# Patient Record
Sex: Female | Born: 1935 | Race: White | Hispanic: No | Marital: Single | Smoking: Never smoker
Health system: Southern US, Community
[De-identification: ages and names within clinical notes are randomized; demographics above are authoritative.]

## PROBLEM LIST (undated history)

## (undated) DIAGNOSIS — Z789 Other specified health status: Secondary | ICD-10-CM

## (undated) HISTORY — PX: OTHER SURGICAL HISTORY: SHX169

---

## 2013-09-24 ENCOUNTER — Inpatient Hospital Stay: Payer: Self-pay | Admitting: Internal Medicine

## 2013-09-24 DIAGNOSIS — I059 Rheumatic mitral valve disease, unspecified: Secondary | ICD-10-CM

## 2013-09-24 LAB — APTT: Activated PTT: <23 secs — ABNORMAL LOW (ref 23.6–35.9)

## 2013-09-24 LAB — CBC WITH DIFFERENTIAL/PLATELET
Basophil #: 0 10*3/uL (ref 0.0–0.1)
Basophil %: 0.1 %
Eosinophil #: 0 10*3/uL (ref 0.0–0.7)
Eosinophil %: 0 %
HCT: 40.1 % (ref 35.0–47.0)
Lymphocyte #: 0.8 10*3/uL — ABNORMAL LOW (ref 1.0–3.6)
Lymphocyte %: 6.3 %
MCHC: 33.6 g/dL (ref 32.0–36.0)
Monocyte #: 0.9 x10 3/mm (ref 0.2–0.9)
Monocyte %: 7.2 %
Neutrophil #: 11.1 10*3/uL — ABNORMAL HIGH (ref 1.4–6.5)
Neutrophil %: 86.4 %
Platelet: 153 10*3/uL (ref 150–440)
RBC: 4.02 10*6/uL (ref 3.80–5.20)
RDW: 13 % (ref 11.5–14.5)
WBC: 12.9 10*3/uL — ABNORMAL HIGH (ref 3.6–11.0)

## 2013-09-24 LAB — COMPREHENSIVE METABOLIC PANEL
Albumin: 3.8 g/dL (ref 3.4–5.0)
Alkaline Phosphatase: 79 U/L
Anion Gap: 7 (ref 7–16)
BUN: 17 mg/dL (ref 7–18)
Bilirubin,Total: 0.9 mg/dL (ref 0.2–1.0)
Chloride: 98 mmol/L (ref 98–107)
Co2: 30 mmol/L (ref 21–32)
Creatinine: 0.83 mg/dL (ref 0.60–1.30)
EGFR (African American): >60
EGFR (Non-African Amer.): >60
Glucose: 178 mg/dL — ABNORMAL HIGH (ref 65–99)
Potassium: 2.9 mmol/L — ABNORMAL LOW (ref 3.5–5.1)
Total Protein: 7.4 g/dL (ref 6.4–8.2)

## 2013-09-24 LAB — URINALYSIS, COMPLETE
Bacteria: NONE SEEN
Bilirubin,UR: NEGATIVE
Blood: NEGATIVE
Glucose,UR: >=500 mg/dL (ref 0–75)
Hyaline Cast: 1
Nitrite: NEGATIVE
Ph: 9 (ref 4.5–8.0)
Protein: >=500
RBC,UR: 12 /HPF (ref 0–5)
Specific Gravity: 1.011 (ref 1.003–1.030)
WBC UR: 2 /HPF (ref 0–5)

## 2013-09-24 LAB — CK-MB
CK-MB: 7.2 ng/mL — ABNORMAL HIGH (ref 0.5–3.6)
CK-MB: 6 ng/mL — ABNORMAL HIGH (ref 0.5–3.6)

## 2013-09-24 LAB — TROPONIN I
Troponin-I: 0.32 ng/mL — ABNORMAL HIGH
Troponin-I: 0.29 ng/mL — ABNORMAL HIGH

## 2013-09-24 LAB — CK TOTAL AND CKMB (NOT AT ARMC)
CK, Total: 531 U/L — ABNORMAL HIGH (ref 21–215)
CK-MB: 7 ng/mL — ABNORMAL HIGH (ref 0.5–3.6)

## 2013-09-24 LAB — PROTIME-INR
INR: 1
Prothrombin Time: 12.9 secs (ref 11.5–14.7)

## 2013-09-25 LAB — LIPID PANEL
Cholesterol: 195 mg/dL (ref 0–200)
Ldl Cholesterol, Calc: 97 mg/dL (ref 0–100)

## 2013-09-25 LAB — MAGNESIUM: Magnesium: 1.7 mg/dL — ABNORMAL LOW

## 2013-09-27 LAB — CBC WITH DIFFERENTIAL/PLATELET
Basophil #: 0.1 10*3/uL (ref 0.0–0.1)
Basophil %: 1 %
Eosinophil #: 0.3 10*3/uL (ref 0.0–0.7)
Eosinophil %: 4.3 %
Lymphocyte #: 2 10*3/uL (ref 1.0–3.6)
Lymphocyte %: 27 %
Neutrophil %: 58 %

## 2013-09-27 LAB — BASIC METABOLIC PANEL
Anion Gap: 7 (ref 7–16)
Calcium, Total: 8.9 mg/dL (ref 8.5–10.1)
Chloride: 110 mmol/L — ABNORMAL HIGH (ref 98–107)
EGFR (African American): >60
Glucose: 110 mg/dL — ABNORMAL HIGH (ref 65–99)
Osmolality: 280 (ref 275–301)
Potassium: 4.2 mmol/L (ref 3.5–5.1)
Sodium: 140 mmol/L (ref 136–145)

## 2013-09-27 LAB — MAGNESIUM: Magnesium: 2.1 mg/dL

## 2013-10-05 ENCOUNTER — Other Ambulatory Visit: Payer: Self-pay | Admitting: Family Medicine

## 2013-10-05 LAB — URINALYSIS, COMPLETE
Bilirubin,UR: NEGATIVE
Glucose,UR: NEGATIVE mg/dL (ref 0–75)
Leukocyte Esterase: NEGATIVE
Nitrite: POSITIVE
Ph: 5 (ref 4.5–8.0)
RBC,UR: 1 /HPF (ref 0–5)
Squamous Epithelial: NONE SEEN
WBC UR: 6 /HPF (ref 0–5)

## 2015-01-31 NOTE — Discharge Summary (Signed)
PATIENT NAME:  Kim [name], Kim [name] MR#:  [id number] DATE OF BIRTH:  [birth year]  DATE OF ADMISSION:  09/24/2013 DATE OF DISCHARGE:  09/27/2013  DISCHARGE DIAGNOSES: 1.  Syncopal episode due to dehydration and gastroenteritis, severe, ruled out by MRI.  2.  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 Needs outpatient followup.  3.  Hypokalemia.  4.  Hypertension.   CODE STATUS ON DISCHARGE: FULL CODE.   DISCHARGE MEDICATIONS: 1.  Loperamide 2 mg oral capsule 4 times a day for 5 days as needed for diarrhea.  2.  Atorvastatin 20 mg once a day.  3.  Amlodipine 1 tablet once a day.  4.  Aspirin 81 mg once a day.   DIET ON DISCHARGE: Low sodium.   Advised to follow up within 2 to 4 weeks with primary care physician and within 1 to 2 months with a vascular surgeon for carotid artery blockages.   HISTORY OF PRESENT ILLNESS: On 09/24/2014 by me, a 80 year old female with unknown past medical history and not seeing a doctor, last she saw [facility], possibly in 2008, found having confused, not able to give any more history, obtained from the patient's neighbor and good friend, Mr. Lorne Skeens, who was present in the room. As per him, the patient lives alone, not taking good care of herself,  old fashioned, not having even a stove for cooking. She used old fashioned wood stove, and also uses that one for heating in the home. As per him, she was not nicely cooking or eating every day for the last few days and was getting weaker.  Her nephew, who lives in a nearby town, called her, and she did not respond, so he called the neighbor, Mr. Lorne Skeens, to go and check, and they found her not opening the door, so EMS arrived, and they opened the house and found her in the kitchen lying nearby the fridge, was confused and lethargic. Her house was very cold, no heating in the house, and she was covered in vomitus and urine on presentation.   HOSPITAL COURSE AND STAY:  1.  With the syncopal episode and altered mental  status on presentation, she was admitted with possibility of rule out CVA, as on CT of the brain there was some suspect of right-sided lacunar infarct, admitted to telemetry floor and IV fluids were given. The patient had significant improvement in her mental status. After she got treatment of her gastroenteritis, her diarrhea stopped with symptomatic treatment. MRI of the brain does not show any acute infarct. Also we did workup with echocardiogram, showed ejection fraction 50% to 55%, and carotid Doppler study shows internal carotid artery less than 50% on the right side, and left side 50% to 69% stenosis, so we advised her to follow with a vascular doctor in the clinic.  For her diarrhea and vomiting, we gave symptomatic treatment of Zofran and loperamide and IV fluids, and she responded nicely to that. She was also found having elevated troponin up to the range of 0.3, but on further followup it remained stable at that level, and we attributed it to gastroenteritis and episode of falling down and dehydration.  2.  Hypokalemia and hypomagnesemia. She was not eating well and with this diarrhea, she found having hypokalemia. We replaced it orally, and she had good response to that.  3. Overall poor living conditions. Social worker stepped into this issue and arranged for her to go to rehab as was advised by physical therapy.   IMPORTANT  LABORATORY RESULTS IN THE HOSPITAL: CK level was 531 on admission. WBC was 12.9. Hemoglobin 13.5, platelet count 153 and MCV was 100. Creatinine was 0.83, sodium 135 and potassium was 2.9 on admission. Troponin was 0.17. Urinalysis was grossly negative with 5 WBCs, leukocyte esterase negative, nitrite negative. Urine culture remained negative. CT cervical spine without contrast was done on admission with CT head, and it showed mild age-related atrophy, ventriculomegaly, probable remote lacunar infarct in basal ganglia, suspect right cerebellar infarct .  US carotid Doppler, as  mentioned above, estimated left internal carotid artery stenosis of 50% to 69% and right internal carotid artery stenosis less than 50%. Troponin went up to 0.29 on further followup and remained stable at that level. MRA of the brain without contrast did not show any acute finding, and her potassium level improved to 4.2 on further followup after 2 days of treatment and replacements. Hemoglobin remained stable at 12.0.   TOTAL TIME SPENT ON THIS DISCHARGE: 40 minutes   ____________________________ Hope PigeonVaibhavkumar G. Elisabeth PigeonVachhani, MD vgv:dmm D: 09/27/2013 10:53:00 ET T: 09/27/2013 11:19:34 ET JOB#: [id number]  cc: Hope PigeonVaibhavkumar G. Elisabeth PigeonVachhani, MD, <Dictator> Sydnee Cabalhomas H. Butler DenmarkWroth, MD Altamese DillingVAIBHAVKUMAR Reedy Biernat MD ELECTRONICALLY SIGNED 10/01/2013 14:38

## 2015-01-31 NOTE — H&P (Signed)
PATIENT NAME:  Kim Kim [name], Kim Kim [name] MR#:  [id number] DATE OF BIRTH:  [birth year]  DATE OF ADMISSION:  09/24/2013  PRIMARY CARE PHYSICIAN: None.   REFERRING EMERGENCY ROOM PHYSICIAN: Dr. Rockne MenghiniAnne-Caroline Norman.  CHIEF COMPLAINT: Syncopal episode, fall.   HISTORY OF PRESENT ILLNESS: The patient is Kim [name] with unknown past medical history, not seeing Kim [name] doctor. Last time she saw [facility], possibly in 2008, but she is confused so not able to give more history. History obtained from patient's neighbor and good friend who is present in the room, Mr. Royal Piedraimmy Vann. As per him, the patient lives alone and not taking good care of herself. She is old-fashioned and she does not have even Kim [name] stove for cooking at home. She uses old-fashioned wood stove for cooking and does not have heating at home, and he suspects that she has not been cooking even every day and has not been eating very well. Today morning, the patient's nephew who lives in nearby town called her. She was not responding, so he finally called this neighbor, Mr. Lorne Skeensimmy, to go and check on her and he found that she is not opening the door. There is no sounds or no noise in the house. They finally called sheriff and EMS, and they opened the door and went inside from window, found her on the floor in kitchen between fridge and freezer and very confused and lethargic so brought to the Emergency Room. Her house was very cold, no heat in the house, and she was covered with her vomitus and urine. On further questioning, he agrees that after coming to ER and receiving some IV fluid, the patient perked up Kim [name] little bit, and she is more responsive now. The patient is talking to me, but she is confused about which month it is. She is able to tell me about 2014, and she identifies the persons around her, her neighbor and nephew, but she does not know exactly what happened and how she fell down and when she fell down. She claims that she woke up and did her routine  morning stuff, and after that she does not know what happened, but neighbor is doubtful about that, that she is very sure about this event. The patient complains that she has been vomiting and some diarrhea for the last few days, but she claims that she was still earing and was able to keep it down until yesterday, which is again not very reliable as per the neighbor.   REVIEW OF SYSTEMS:  CONSTITUTIONAL: Negative for fever. Positive for generalized weakness. No pain or weight loss.  EYES: No blurring, double vision, discharge, or redness.  EARS, NOSE, THROAT: No tinnitus, ear pain, or hearing loss.  RESPIRATORY: No cough, wheezing, hemoptysis, shortness of breath.  CARDIOVASCULAR: No chest pain, orthopnea, edema, or palpitations but had syncopal episode. The patient does not know much details.  GASTROINTESTINAL: Has nausea, vomiting, and some diarrhea but no abdominal pain.  GENITOURINARY: No dysuria, hematuria, or increased frequency.  ENDOCRINE: No increased sweating. No heat or cold intolerance.  SKIN: No acne, rashes, or lesions in the skin.  MUSCULOSKELETAL: No pain or swelling in the joints.  NEUROLOGIC: No numbness or focal weakness. Has generalized weakness.   PAST MEDICAL HISTORY: Not known.  PAST SURGICAL HISTORY: Gallbladder surgery years ago.   SOCIAL HISTORY: She is not married, has no kids, lives alone on her own, and neighbors or good friends taking care of her day-to-day requirements. Not Kim [name] smoker, no drinking  alcohol, and not any illegal drug use.   FAMILY HISTORY: As per her nephew who is present in the room, one of her sisters had Kim [name] heart problem, and her brother also had heart issues. Both of them had it at reasonably younger age of 43s.   HOME MEDICATIONS: None.   PHYSICAL EXAMINATION: VITAL SIGNS: In ER, temperature 98.3, pulse 92, respirations 20, blood pressure 195/147 on presentation, which is 157/61 now, oxygen saturation 96 on room air.  GENERAL: The patient is  alert but oriented x 2, not very little to the events that happened and time,  but cooperative with history taking and physical examination. Appears overall very disheveled and not overall taking care of herself nicely.  HEENT: Head and neck atraumatic. Conjunctivae pink. Oral mucosa dry.  NECK: Supple. No JVD.  RESPIRATORY: Bilateral clear and equal air entry.  CARDIOVASCULAR: S1, S2 present. Systolic murmur present.  ABDOMEN: Soft, nontender. Bowel sounds present. No organomegaly.  SKIN: No rashes, but there is Kim [name] small abrasion mark on the left lower limb.  MUSCULOSKELETAL: Joints: No swelling or tenderness.  NEUROLOGICAL: Able to move all 4 limbs and follows commands, but left lower limb appears slightly weaker than right side. Power 4 out of 5 all 4 limbs. Coordination: Unable to check, as the patient appears slightly weak and I do not want her to stand up and check the balance because of fear of falling down.   IMPORTANT LABORATORY AND RADIOLOGICAL RESULTS: Glucose 178, BUN 17, creatinine 0.83, sodium 135, potassium 2.9, chloride 98, CO2 of 30, calcium 8.7. Troponin 0.17, CK total 531. WBC 12.9, hemoglobin 13.5, platelet count 153. Urinalysis is grossly negative. CT scan of the cervical spine and head done, which showed mild age-related cerebral atrophy, ventriculomegaly, and periventricular white matter disease. Probable remote lacunar-type basal ganglia infarct. Suspect right cerebellar infarct of uncertain age. MRI of brain recommendation for further evaluation. Severe degenerative cervical spondylosis with cervical multiple disk disease and says that this is no acute cervical spine fracture. Chest x-ray without any significant finding.   ASSESSMENT AND PLAN: Kim [name] with unknown past medical history and not following very well with any doctor, not taking medication, and living alone, not eating very well, and no heating in the house. Found on the floor kind of confused today  morning. Had diarrhea and vomiting for the last 2 days.  1.  Possible acute infarct: As CT scan of the brain suspects right cerebellar infarct, that might be the cause of her syncopal episode and confusion but will have to confirm with MRI. Will get physical therapy evaluation, echocardiogram, and carotid Doppler study. Will check lipid panel, give her aspirin and statin for now, and continue neuro checks.  2.  Diarrhea and vomiting: Most likely this is viral gastroenteritis, and it caused dehydration and her to fall down. Will give symptomatic management with loperamide and Zofran.  3.  Elevated troponin: Most likely it is due to stress of fall and gastroenteritis. Will still follow serial troponins every 4 hours for 3 times to make sure it is not rising and not an acute coronary event.  4.  Hypokalemia: Likely it is due to diarrhea. We will replace it and check magnesium level. 5.  Overall poor living conditions. I will call case manager services to look into this issue. Maybe she needs Adult Protective Services or some kind of social support and help to establish heating in her house and to help her with these issues.   TOTAL  TIME SPENT ON THIS ADMISSION: 50 minutes.   ____________________________ Hope Pigeon Elisabeth Pigeon, MD vgv:jcm D: 09/24/2013 16:21:06 ET T: 09/24/2013 18:19:54 ET JOB#: [id number]  cc: Hope Pigeon. Elisabeth Pigeon, MD, <Dictator> Altamese Dilling MD ELECTRONICALLY SIGNED 10/01/2013 14:36

## 2018-10-08 ENCOUNTER — Emergency Department: Payer: Medicare Other

## 2018-10-08 ENCOUNTER — Observation Stay: Payer: Medicare Other

## 2018-10-08 ENCOUNTER — Other Ambulatory Visit: Payer: Self-pay

## 2018-10-08 ENCOUNTER — Encounter: Payer: Self-pay | Admitting: Emergency Medicine

## 2018-10-08 ENCOUNTER — Inpatient Hospital Stay
Admission: EM | Admit: 2018-10-08 | Discharge: 2018-10-11 | DRG: 558 | Disposition: A | Discharge status: Skilled Nursing Facility | Payer: Medicare Other | Attending: Internal Medicine | Admitting: Internal Medicine

## 2018-10-08 DIAGNOSIS — M25512 Pain in left shoulder: Secondary | ICD-10-CM | POA: Diagnosis present

## 2018-10-08 DIAGNOSIS — M6282 Rhabdomyolysis: Principal | ICD-10-CM | POA: Diagnosis present

## 2018-10-08 DIAGNOSIS — Z7982 Long term (current) use of aspirin: Secondary | ICD-10-CM

## 2018-10-08 DIAGNOSIS — L89152 Pressure ulcer of sacral region, stage 2: Secondary | ICD-10-CM | POA: Diagnosis present

## 2018-10-08 DIAGNOSIS — L8990 Pressure ulcer of unspecified site, unspecified stage: Secondary | ICD-10-CM

## 2018-10-08 DIAGNOSIS — Z66 Do not resuscitate: Secondary | ICD-10-CM | POA: Diagnosis present

## 2018-10-08 DIAGNOSIS — E870 Hyperosmolality and hypernatremia: Secondary | ICD-10-CM | POA: Diagnosis present

## 2018-10-08 DIAGNOSIS — N3000 Acute cystitis without hematuria: Secondary | ICD-10-CM | POA: Diagnosis present

## 2018-10-08 DIAGNOSIS — E871 Hypo-osmolality and hyponatremia: Secondary | ICD-10-CM | POA: Diagnosis present

## 2018-10-08 DIAGNOSIS — Z8249 Family history of ischemic heart disease and other diseases of the circulatory system: Secondary | ICD-10-CM | POA: Diagnosis not present

## 2018-10-08 DIAGNOSIS — E860 Dehydration: Secondary | ICD-10-CM | POA: Diagnosis present

## 2018-10-08 DIAGNOSIS — R778 Other specified abnormalities of plasma proteins: Secondary | ICD-10-CM

## 2018-10-08 DIAGNOSIS — R9431 Abnormal electrocardiogram [ECG] [EKG]: Secondary | ICD-10-CM | POA: Diagnosis present

## 2018-10-08 DIAGNOSIS — R7989 Other specified abnormal findings of blood chemistry: Secondary | ICD-10-CM | POA: Diagnosis present

## 2018-10-08 DIAGNOSIS — R531 Weakness: Secondary | ICD-10-CM

## 2018-10-08 DIAGNOSIS — D638 Anemia in other chronic diseases classified elsewhere: Secondary | ICD-10-CM | POA: Diagnosis present

## 2018-10-08 DIAGNOSIS — W19XXXA Unspecified fall, initial encounter: Secondary | ICD-10-CM | POA: Diagnosis present

## 2018-10-08 DIAGNOSIS — Z79899 Other long term (current) drug therapy: Secondary | ICD-10-CM | POA: Diagnosis not present

## 2018-10-08 DIAGNOSIS — B9620 Unspecified Escherichia coli [E. coli] as the cause of diseases classified elsewhere: Secondary | ICD-10-CM | POA: Diagnosis present

## 2018-10-08 DIAGNOSIS — R52 Pain, unspecified: Secondary | ICD-10-CM

## 2018-10-08 HISTORY — DX: Other specified health status: Z78.9

## 2018-10-08 LAB — COMPREHENSIVE METABOLIC PANEL
ALT: 58 U/L — ABNORMAL HIGH (ref 0–44)
ANION GAP: 8 (ref 5–15)
AST: 81 U/L — ABNORMAL HIGH (ref 15–41)
Albumin: 3.8 g/dL (ref 3.5–5.0)
Alkaline Phosphatase: 60 U/L (ref 38–126)
BUN: 59 mg/dL — ABNORMAL HIGH (ref 8–23)
CALCIUM: 9.2 mg/dL (ref 8.9–10.3)
CO2: 25 mmol/L (ref 22–32)
Chloride: 113 mmol/L — ABNORMAL HIGH (ref 98–111)
Creatinine, Ser: 1.39 mg/dL — ABNORMAL HIGH (ref 0.44–1.00)
GFR calc Af Amer: 41 mL/min — ABNORMAL LOW (ref >60)
GFR calc non Af Amer: 35 mL/min — ABNORMAL LOW (ref >60)
Glucose, Bld: 120 mg/dL — ABNORMAL HIGH (ref 70–99)
Potassium: 4 mmol/L (ref 3.5–5.1)
Sodium: 146 mmol/L — ABNORMAL HIGH (ref 135–145)
Total Bilirubin: 0.8 mg/dL (ref 0.3–1.2)
Total Protein: 6.5 g/dL (ref 6.5–8.1)

## 2018-10-08 LAB — CBC WITH DIFFERENTIAL/PLATELET
Abs Immature Granulocytes: 0.04 10*3/uL (ref 0.00–0.07)
Basophils Absolute: 0 10*3/uL (ref 0.0–0.1)
Basophils Relative: 0 %
EOS PCT: 0 %
Eosinophils Absolute: 0 10*3/uL (ref 0.0–0.5)
HCT: 37.6 % (ref 36.0–46.0)
Hemoglobin: 12.4 g/dL (ref 12.0–15.0)
Immature Granulocytes: 0 %
Lymphocytes Relative: 10 %
Lymphs Abs: 1.1 10*3/uL (ref 0.7–4.0)
MCH: 34.4 pg — ABNORMAL HIGH (ref 26.0–34.0)
MCHC: 33 g/dL (ref 30.0–36.0)
MCV: 104.4 fL — ABNORMAL HIGH (ref 80.0–100.0)
Monocytes Absolute: 0.8 10*3/uL (ref 0.1–1.0)
Monocytes Relative: 7 %
Neutro Abs: 8.9 10*3/uL — ABNORMAL HIGH (ref 1.7–7.7)
Neutrophils Relative %: 83 %
Platelets: 148 10*3/uL — ABNORMAL LOW (ref 150–400)
RBC: 3.6 MIL/uL — AB (ref 3.87–5.11)
RDW: 12.9 % (ref 11.5–15.5)
WBC: 10.9 10*3/uL — AB (ref 4.0–10.5)
nRBC: 0 % (ref 0.0–0.2)

## 2018-10-08 LAB — URINALYSIS, COMPLETE (UACMP) WITH MICROSCOPIC
Bilirubin Urine: NEGATIVE
Glucose, UA: NEGATIVE mg/dL
Ketones, ur: NEGATIVE mg/dL
NITRITE: NEGATIVE
Protein, ur: NEGATIVE mg/dL
Specific Gravity, Urine: 1.021 (ref 1.005–1.030)
pH: 5 (ref 5.0–8.0)

## 2018-10-08 LAB — TROPONIN I
Troponin I: 0.53 ng/mL (ref <0.03)
Troponin I: 0.34 ng/mL (ref <0.03)
Troponin I: 0.46 ng/mL (ref <0.03)

## 2018-10-08 LAB — CK: Total CK: 1025 U/L — ABNORMAL HIGH (ref 38–234)

## 2018-10-08 MED ORDER — ASPIRIN 81 MG PO CHEW
81.0000 mg | CHEWABLE_TABLET | Freq: Every day | ORAL | Status: DC
  Administered 2018-10-09 – 2018-10-11 (×3): 81 mg via ORAL
  Filled 2018-10-08 (×3): qty 1

## 2018-10-08 MED ORDER — ONDANSETRON HCL 4 MG/2ML IJ SOLN: 4.0000 mg | Freq: Four times a day (QID) | INTRAMUSCULAR | Status: DC | PRN

## 2018-10-08 MED ORDER — ACETAMINOPHEN 650 MG RE SUPP: 650.0000 mg | Freq: Four times a day (QID) | RECTAL | Status: DC | PRN

## 2018-10-08 MED ORDER — ONDANSETRON HCL 4 MG PO TABS: 4.0000 mg | ORAL_TABLET | Freq: Four times a day (QID) | ORAL | Status: DC | PRN

## 2018-10-08 MED ORDER — ADULT MULTIVITAMIN W/MINERALS CH
1.0000 | ORAL_TABLET | Freq: Every day | ORAL | Status: DC
  Administered 2018-10-09 – 2018-10-11 (×2): 1 via ORAL
  Filled 2018-10-08 (×2): qty 1

## 2018-10-08 MED ORDER — ACETAMINOPHEN 325 MG PO TABS
650.0000 mg | ORAL_TABLET | Freq: Four times a day (QID) | ORAL | Status: DC | PRN
  Administered 2018-10-08: 650 mg via ORAL
  Filled 2018-10-08 (×2): qty 2

## 2018-10-08 MED ORDER — SODIUM CHLORIDE 0.9 % IV SOLN
1.0000 g | INTRAVENOUS | Status: DC
  Administered 2018-10-08 – 2018-10-09 (×2): 1 g via INTRAVENOUS
  Filled 2018-10-08 (×3): qty 10

## 2018-10-08 MED ORDER — ACETAMINOPHEN 500 MG PO TABS: 500.0000 mg | ORAL_TABLET | Freq: Four times a day (QID) | ORAL | Status: DC | PRN

## 2018-10-08 MED ORDER — SODIUM CHLORIDE 0.9 % IV SOLN
INTRAVENOUS | Status: DC
  Administered 2018-10-08: 18:00:00 via INTRAVENOUS

## 2018-10-08 NOTE — Plan of Care (Signed)
  Problem: Education: Goal: Knowledge of General Education information will improve Description Including pain rating scale, medication(s)/side effects and non-pharmacologic comfort measures Outcome: Progressing   Problem: Clinical Measurements: Goal: Ability to maintain clinical measurements within normal limits will improve Outcome: Progressing Goal: Diagnostic test results will improve Outcome: Progressing   Problem: Pain Managment: Goal: General experience of comfort will improve Outcome: Progressing   

## 2018-10-08 NOTE — Clinical Social Work Note (Signed)
CSW received consult for possible SNF placement. PT is pending. CSW will assess once PT has evaluated.  Argentina PonderKaren Martha Desarai Barrack, MSW, Theresia MajorsLCSWA [phone]

## 2018-10-08 NOTE — ED Triage Notes (Signed)
Pt from home via ems- lives alone states that she fell once on 12/24 and again on the 27th. Pt c/o left shoulder and hip pain. Multiple skin tears. Pt A/O. Denies LOC, no use of blood thinners. Per EMS pt ambulatory on scene.

## 2018-10-08 NOTE — ED Provider Notes (Signed)
[facility] Emergency Department Provider Note  ____________________________________________   I have reviewed the triage vital signs and the nursing notes.   HISTORY  Chief Complaint Fall   History limited by: Not Limited   HPI Kim [name] is a 83 y.o. female who presents to the emergency department today because of concern for two falls over the past week. The patient states that she does not have a history of frequent falls. She has felt weak over the past week. Has not been able to get herself up after her falls. She is complaining of some pain in her left shoulder and left buttock. She is also complaining of a headache and states she did hit her head. She denies any fevers. No chest pain, shortness of breath. She denies any bloody stool or vomiting.    History reviewed. No pertinent past medical history.  There are no active problems to display for this patient.   Prior to Admission medications   Medication Sig Start Date End Date Taking? Authorizing Provider  acetaminophen (TYLENOL) 500 MG tablet Take 500 mg by mouth every 6 (six) hours as needed.   Yes [provider]  aspirin 81 MG chewable tablet Chew 81 mg by mouth daily.   Yes [provider]  Multiple Vitamins-Minerals (MULTIVITAMIN ADULT) TABS Take 1 tablet by mouth daily.   Yes [provider]    Allergies Patient has no known allergies.  No family history on file.  Social History Social History   Tobacco Use  . Smoking status: Not on file  Substance Use Topics  . Alcohol use: Not on file  . Drug use: Not on file    Review of Systems Constitutional: No fever/chills Eyes: No visual changes. ENT: No sore throat. Cardiovascular: Denies chest pain. Respiratory: Denies shortness of breath. Gastrointestinal: No abdominal pain.  No nausea, no vomiting.  No diarrhea.   Genitourinary: Negative for dysuria. Musculoskeletal: Positive for left buttock and  left shoulder pain.  Skin: Positive for redness to her left buttock. Neurological: Positive for headache. ____________________________________________   PHYSICAL EXAM:  VITAL SIGNS: ED Triage Vitals  Enc Vitals Group     BP 10/08/18 1420 138/76     Pulse Rate 10/08/18 1420 79     Resp 10/08/18 1420 20     Temp 10/08/18 1420 97.6 F (36.4 C)     Temp Source 10/08/18 1420 Oral     SpO2 10/08/18 1420 100 %     Weight 10/08/18 1423 148 lb (67.1 kg)     Height 10/08/18 1423 5\' 8"  (1.727 m)     Head Circumference --      Peak Flow --      Pain Score 10/08/18 1421 10   Constitutional: Alert and oriented.  Eyes: Conjunctivae are normal.  ENT      Head: Normocephalic and atraumatic.      Nose: No congestion/rhinnorhea.      Mouth/Throat: Mucous membranes are moist.      Neck: No stridor. Hematological/Lymphatic/Immunilogical: No cervical lymphadenopathy. Cardiovascular: Normal rate, regular rhythm.  No murmurs, rubs, or gallops.  Respiratory: Normal respiratory effort without tachypnea nor retractions. Breath sounds are clear and equal bilaterally. No wheezes/rales/rhonchi. Gastrointestinal: Soft and non tender. No rebound. No guarding.  Genitourinary: Deferred Musculoskeletal:  No tenderness to palpation over bilateral shoulders, elbows, hips or knees.  Patient with some limited range of motion of the left elbow Neurologic:  Normal speech and language. No gross focal neurologic deficits are  appreciated.  Skin: Redness a breakdown noted to the left buttock Psychiatric: Mood and affect are normal. Speech and behavior are normal. Patient exhibits appropriate insight and judgment.  ____________________________________________    LABS (pertinent positives/negatives)  CBC wbc 10.9, hgb 12.4, plt 148 CMP na 146, glu 120, cr 1.39 Trop 0.53  ____________________________________________   EKG  I, Phineas SemenGraydon Saahas Hidrogo, attending physician, personally viewed and interpreted this  EKG  EKG Time: 1604 Rate: 82 Rhythm: sinus rhythm Axis: normal Intervals: qtc 448 QRS: narrow, q waves V1, V2 ST changes: no st elevation Impression: abnormal ekg   ____________________________________________    RADIOLOGY  CT head No acute findings  ____________________________________________   PROCEDURES  Procedures  ____________________________________________   INITIAL IMPRESSION / ASSESSMENT AND PLAN / ED COURSE  Pertinent labs & imaging results that were available during my care of the patient were reviewed by me and considered in my medical decision making (see chart for details).   Patient presented to the emergency department today because of concerns for falls and roughly weeklong history of increased weakness.  Patient without any significant acute traumatic findings however his head CT was performed given that she stated she hit her head.  This was negative.  Patient's blood work was concerning for an elevated troponin.  Will plan on admission to the hospital service.  Discussed findings and plan with patient.   ____________________________________________   FINAL CLINICAL IMPRESSION(S) / ED DIAGNOSES  Final diagnoses:  Weakness  Fall, initial encounter  Elevated troponin     Note: This dictation was prepared with Dragon dictation. Any transcriptional errors that result from this process are unintentional     Phineas SemenGoodman, Manav Pierotti, MD 10/08/18 [phone]

## 2018-10-08 NOTE — H&P (Signed)
[facility] [place] at [facility]   PATIENT NAME: Kim [name]    MR#:  [id number]  DATE OF BIRTH:  1935-07-13  DATE OF ADMISSION:  10/08/2018  PRIMARY CARE PHYSICIAN: Center, [facility]   REQUESTING/REFERRING PHYSICIAN: Dr Tery Sanfilippo  CHIEF COMPLAINT:   Chief Complaint  Patient presents with  . Fall    HISTORY OF PRESENT ILLNESS:  Kim [name]  is a 83 y.o. female comes in after having a few falls at home.  She fell on Christmas Eve and was on the floor at least overnight.  She recently had a fall with carrying wood into the house.  She normally walks with a cane but when she is carrying wood she does not use a cane.  She has been very weak.  Her urine has been dark.  She was noted to have a sore on her backside.  She lives home alone.  Her son is able to check on her numerous times a day.  Patient states that she does have some chills and sweats.  Slight cough but nonproductive.  In the ER she was found to have a borderline troponin and hospitalist services were contacted for further evaluation.  The patient denies chest pain or shortness of breath.  PAST MEDICAL HISTORY:   Past Medical History:  Diagnosis Date  . Medical history non-contributory     PAST SURGICAL HISTORY:   Past Surgical History:  Procedure Laterality Date  . intestinal blockage      SOCIAL HISTORY:   Social History   Tobacco Use  . Smoking status: Never Smoker  . Smokeless tobacco: Never Used  Substance Use Topics  . Alcohol use: Never    Frequency: Never    FAMILY HISTORY:   Family History  Problem Relation Age of Onset  . CAD Father     DRUG ALLERGIES:  No Known Allergies  REVIEW OF SYSTEMS:  CONSTITUTIONAL: No fever, some chills and sweats.  Positive for weight loss.  Positive for weakness.  EYES: No blurred or double vision.  EARS, NOSE, AND THROAT: No tinnitus or ear pain.  Some sore throat RESPIRATORY: Some cough.no shortness of  breath, wheezing or hemoptysis.  CARDIOVASCULAR: No chest pain, orthopnea, edema.  GASTROINTESTINAL: No nausea, vomiting, diarrhea or abdominal pain. No blood in bowel movements GENITOURINARY: No dysuria, hematuria.  ENDOCRINE: No polyuria, nocturia,  HEMATOLOGY: No anemia, easy bruising or bleeding SKIN: No rash or lesion. MUSCULOSKELETAL: Positive for elbow pain and leg pain. NEUROLOGIC: No tingling, numbness, weakness.  PSYCHIATRY: No anxiety or depression.   MEDICATIONS AT HOME:   Prior to Admission medications   Medication Sig Start Date End Date Taking? Authorizing Provider  acetaminophen (TYLENOL) 500 MG tablet Take 500 mg by mouth every 6 (six) hours as needed.   Yes [provider]  aspirin 81 MG chewable tablet Chew 81 mg by mouth daily.   Yes [provider]  Multiple Vitamins-Minerals (MULTIVITAMIN ADULT) TABS Take 1 tablet by mouth daily.   Yes [provider]      VITAL SIGNS:  Blood pressure 138/76, pulse 79, temperature 97.6 F (36.4 C), temperature source Oral, resp. rate 20, height 5\' 8"  (1.727 m), weight 67.1 kg, SpO2 100 %.  PHYSICAL EXAMINATION:  GENERAL:  82 y.o.-year-old patient lying in the bed with no acute distress.  EYES: Pupils equal, round, reactive to light and accommodation. No scleral icterus. Extraocular muscles intact.  HEENT: Head atraumatic, normocephalic. Oropharynx and nasopharynx clear.  NECK:  Supple, no jugular venous distention. No thyroid enlargement, no tenderness.  LUNGS: Normal breath sounds bilaterally, no wheezing, rales,rhonchi or crepitation. No use of accessory muscles of respiration.  CARDIOVASCULAR: S1, S2 normal. No murmurs, rubs, or gallops.  ABDOMEN: Soft, nontender, nondistended. Bowel sounds present. No organomegaly or mass.  EXTREMITIES: No pedal edema, cyanosis, or clubbing.  Left elbow swelling and bruising NEUROLOGIC: Cranial nerves II through XII are intact. Muscle strength 4/5 in all  extremities. Sensation intact. Gait not checked.  PSYCHIATRIC: The patient is alert and answers questions appropriately.  SKIN: Her entire buttock and sacral area is erythematous, she does have a large blackish area there but the skin is intact.  LABORATORY PANEL:   CBC Recent Labs  Lab 10/08/18 1529  WBC 10.9*  HGB 12.4  HCT 37.6  PLT 148*   ------------------------------------------------------------------------------------------------------------------  Chemistries  Recent Labs  Lab 10/08/18 1529  NA 146*  K 4.0  CL 113*  CO2 25  GLUCOSE 120*  BUN 59*  CREATININE 1.39*  CALCIUM 9.2  AST 81*  ALT 58*  ALKPHOS 60  BILITOT 0.8   ------------------------------------------------------------------------------------------------------------------  Cardiac Enzymes Recent Labs  Lab 10/08/18 1529  TROPONINI 0.53*   ------------------------------------------------------------------------------------------------------------------  RADIOLOGY:  Dg Elbow Complete Left  Result Date: 10/08/2018 CLINICAL DATA:  Left elbow pain and swelling secondary to false on 10/03/2018 and 10/06/2018 EXAM: LEFT ELBOW - COMPLETE 3+ VIEW COMPARISON:  None. FINDINGS: There is no acute fracture. No dislocation or appreciable joint effusion. 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 There is soft tissue swelling posteriorly. IMPRESSION: No acute osseous abnormality. 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 Electronically Signed   By: Francene BoyersJames  Maxwell M.D.   On: 10/08/2018 16:38   Ct Head Wo Contrast  Result Date: 10/08/2018 CLINICAL DATA:  Multiple recent falls. EXAM: CT HEAD WITHOUT CONTRAST TECHNIQUE: Contiguous axial images were obtained from the base of the skull through the vertex without intravenous contrast. COMPARISON:  10/08/2013 FINDINGS: Brain: There is new extensive encephalomalacia involving the left temporal lobe, and there is  milder encephalomalacia involving the left frontal lobe. There is ex vacuo dilatation of the left lateral ventricle. No acute large territory infarct, intracranial hemorrhage, mass, midline shift, or extra-axial fluid collection is identified. There is a small chronic right cerebellar infarct. Patchy hypodensities in the cerebral white matter bilaterally are nonspecific but compatible with mild-to-moderate chronic small vessel ischemic disease, perhaps mildly progressed from the prior study. Chronic bilateral basal ganglia region lacunar infarcts are again noted. Vascular: Calcified atherosclerosis at the skull base. No hyperdense vessel. Skull: No fracture or focal osseous lesion. Sinuses/Orbits: Visualized paranasal sinuses and mastoid air cells are clear. Visualized orbits are unremarkable. Other: None. IMPRESSION: 1. No evidence of acute intracranial abnormality. 2. New encephalomalacia involving the left temporal and left frontal lobes which may reflect the sequelae of interval chronic infarcts or remote trauma. 3. Chronic small vessel ischemia with chronic lacunar infarcts as above. Electronically Signed   By: Sebastian AcheAllen  Grady M.D.   On: 10/08/2018 17:02    EKG:   Normal sinus rhythm 82 bpm ST depression inferiorly  IMPRESSION AND PLAN:   1.  Elevated troponin.  Not quite sure what to make of this with the patient denying chest pain or shortness of breath. Serial troponins.  Continue aspirin.  Monitor on telemetry and obtain an echocardiogram.  Check a CPK to see if this is rhabdomyolysis causing this. 2.  Acute cystitis.  Start  Rocephin and obtain a urine culture.  Gentle IV fluid hydration 3.  Stage II decubitus ulcer with an area of blackness.  Unsure if this is necrotic material there that needs debridement or if this is just bruising from the fall.  Surgical evaluation. 4. Weakness and falls.  Get pelvis x-ray make sure she has not broken a small bone marrow.  Physical therapy evaluation.  We will  get social worker involved.  Safety at home with all these falls is going to be an issue. 5.  Dehydration.  IV fluid hydration   All the records are reviewed and case discussed with ED provider. Management plans discussed with the patient, family and they are in agreement.  CODE STATUS: DNR  TOTAL TIME TAKING CARE OF THIS PATIENT: 50 minutes, including ACP time.    Alford Highlandichard Shenay Torti M.D on 10/08/2018 at 5:04 PM  Between 7am to 6pm - Pager - [phone]  After 6pm call admission pager [phone]  [facility] Office  [phone]  CC: Primary care physician; [facility]

## 2018-10-08 NOTE — ED Notes (Signed)
Patient undressed and placed in hospital gown. Patient with old bruising noted to left elbow and hip. Patient also had significant redness and abrasions noted to backside. Small abrasion noted to left scapula and left hip. Area cleaned and foam pad applied to sacrum and non-stick dressing applied to abrasions on hip.

## 2018-10-08 NOTE — Progress Notes (Addendum)
Patient ID: Kim [name], female   DOB: 01/18/1935, 83 y.o.   MRN: [id number]   ACP note  Patient and son at the bedside  Diagnosis: Elevated troponin, acute cystitis, stage II decubitus with area of black material, weakness and falls, dehydration  CODE STATUS discussed and patient wishes to be a DO NOT RESUSCITATE  Plan.  Gentle IV fluid hydration serial troponins echocardiogram monitor on telemetry.  Physical therapy evaluation.  Surgical evaluation on the decubitus whether this needs debridement or not.  Safety at home is a question.  Time spent on ACP discussion 17 minutes Dr. Alford Highlandichard Shawne Eskelson

## 2018-10-08 NOTE — Progress Notes (Signed)
Pt assisted x2 to stand, turn and sit on bed from stretcher. Pt placed on bed alarm, high fall risk due to frequent falls at home. Low bed to be ordered by Diplomatic Services operational officersecretary.

## 2018-10-08 NOTE — Progress Notes (Signed)
Patient ID: Kim [name], female   DOB: 07/30/1935, 83 y.o.   MRN: [id number]  CPK elevated  Change to inpatient status Dx rhabdomyolysis

## 2018-10-08 NOTE — ED Notes (Signed)
Patient assisted to use bedpan. Patient cleaned and repositioned in bed. Given sandwich tray and water to drink. Son at bedside.

## 2018-10-09 LAB — BASIC METABOLIC PANEL
Anion gap: 8 (ref 5–15)
BUN: 45 mg/dL — AB (ref 8–23)
CO2: 25 mmol/L (ref 22–32)
Calcium: 8.7 mg/dL — ABNORMAL LOW (ref 8.9–10.3)
Chloride: 114 mmol/L — ABNORMAL HIGH (ref 98–111)
Creatinine, Ser: 1.09 mg/dL — ABNORMAL HIGH (ref 0.44–1.00)
GFR calc Af Amer: 55 mL/min — ABNORMAL LOW (ref >60)
GFR calc non Af Amer: 48 mL/min — ABNORMAL LOW (ref >60)
GLUCOSE: 103 mg/dL — AB (ref 70–99)
Potassium: 4 mmol/L (ref 3.5–5.1)
SODIUM: 147 mmol/L — AB (ref 135–145)

## 2018-10-09 LAB — CBC
HCT: 32.8 % — ABNORMAL LOW (ref 36.0–46.0)
Hemoglobin: 10.7 g/dL — ABNORMAL LOW (ref 12.0–15.0)
MCH: 34.2 pg — ABNORMAL HIGH (ref 26.0–34.0)
MCHC: 32.6 g/dL (ref 30.0–36.0)
MCV: 104.8 fL — ABNORMAL HIGH (ref 80.0–100.0)
Platelets: 127 10*3/uL — ABNORMAL LOW (ref 150–400)
RBC: 3.13 MIL/uL — ABNORMAL LOW (ref 3.87–5.11)
RDW: 13 % (ref 11.5–15.5)
WBC: 7.5 10*3/uL (ref 4.0–10.5)
nRBC: 0 % (ref 0.0–0.2)

## 2018-10-09 LAB — CK: Total CK: 585 U/L — ABNORMAL HIGH (ref 38–234)

## 2018-10-09 MED ORDER — SODIUM CHLORIDE 0.45 % IV SOLN
INTRAVENOUS | Status: DC
  Administered 2018-10-09 (×2): via INTRAVENOUS

## 2018-10-09 MED ORDER — ENOXAPARIN SODIUM 40 MG/0.4ML ~~LOC~~ SOLN
40.0000 mg | SUBCUTANEOUS | Status: DC
  Administered 2018-10-09 – 2018-10-10 (×2): 40 mg via SUBCUTANEOUS
  Filled 2018-10-09 (×2): qty 0.4

## 2018-10-09 MED ORDER — TRAMADOL HCL 50 MG PO TABS
50.0000 mg | ORAL_TABLET | Freq: Two times a day (BID) | ORAL | Status: DC | PRN
  Administered 2018-10-09 – 2018-10-11 (×4): 50 mg via ORAL
  Filled 2018-10-09 (×5): qty 1

## 2018-10-09 NOTE — Consult Note (Signed)
WOC Nurse wound consult note Reason for Consult: 100% eschar to sacrum.  Unstageable pressure injury to sacrum  Surgery has evaluated and no intervention needed.  Will turn and reposition every two hours and offload. Wound type:unstageable pressure injury Pressure Injury POA: Yes Measurement 2 cmx 2 cm eschar Wound bed: dry stable eschar Drainage (amount, consistency, odor) none Periwound:intact Dressing procedure/placement/frequency:open to air.  Turn and reposition every two hours.  Will not follow at this time.  Please re-consult if needed.  Maple HudsonKaren Capri Veals MSN, RN, FNP-BC CWON Wound, Ostomy, Continence Nurse Pager [phone]

## 2018-10-09 NOTE — Progress Notes (Signed)
[facility] - [place] [facility]   PATIENT NAME: Kim [name]    MR#:  [id number]  DATE OF BIRTH:  04/24/1935  SUBJECTIVE:  CHIEF COMPLAINT:   Chief Complaint  Patient presents with  . Fall   Answers appropriately mostly. Poor historian.  REVIEW OF SYSTEMS:    Review of Systems  Unable to perform ROS: Mental status change    DRUG ALLERGIES:  No Known Allergies  VITALS:  Blood pressure 132/64, pulse (!) 59, temperature 98.4 F (36.9 C), temperature source Oral, resp. rate 19, height 5\' 2"  (1.575 m), weight 67.2 kg, SpO2 97 %.  PHYSICAL EXAMINATION:   Physical Exam  GENERAL:  83 y.o.-year-old patient lying in the bed with no acute distress.  EYES: Pupils equal, round, reactive to light and accommodation. No scleral icterus. Extraocular muscles intact.  HEENT: Head atraumatic, normocephalic. Oropharynx and nasopharynx clear.  NECK:  Supple, no jugular venous distention. No thyroid enlargement, no tenderness.  LUNGS: Normal breath sounds bilaterally, no wheezing, rales, rhonchi. No use of accessory muscles of respiration.  CARDIOVASCULAR: S1, S2 normal. No murmurs, rubs, or gallops.  ABDOMEN: Soft, nontender, nondistended. Bowel sounds present. No organomegaly or mass.  EXTREMITIES: No cyanosis, clubbing or edema b/l.    NEUROLOGIC: Cranial nerves II through XII are intact. Motor 5-/5 in upper and lower extremities PSYCHIATRIC: The patient is alert and awake SKIN: No obvious rash, lesion, or ulcer.   LABORATORY PANEL:   CBC Recent Labs  Lab 10/09/18 0343  WBC 7.5  HGB 10.7*  HCT 32.8*  PLT 127*   ------------------------------------------------------------------------------------------------------------------ Chemistries  Recent Labs  Lab 10/08/18 1529 10/09/18 0343  NA 146* 147*  K 4.0 4.0  CL 113* 114*  CO2 25 25  GLUCOSE 120* 103*  BUN 59* 45*  CREATININE 1.39* 1.09*  CALCIUM 9.2 8.7*  AST 81*  --   ALT 58*  --   ALKPHOS 60  --    BILITOT 0.8  --    ------------------------------------------------------------------------------------------------------------------  Cardiac Enzymes Recent Labs  Lab 10/08/18 2238  TROPONINI 0.46*   ------------------------------------------------------------------------------------------------------------------  RADIOLOGY:  Dg Pelvis 1-2 Views  Result Date: 10/08/2018 CLINICAL DATA:  Sacral pain.  No known injury. EXAM: PELVIS - 1-2 VIEW COMPARISON:  Radiograph of September 24, 2013. FINDINGS: There is no evidence of pelvic fracture or diastasis. No pelvic bone lesions are seen. IMPRESSION: Negative. Electronically Signed   By: Lupita RaiderJames  Green Jr, M.D.   On: 10/08/2018 17:39   Dg Elbow Complete Left  Result Date: 10/08/2018 CLINICAL DATA:  Left elbow pain and swelling secondary to false on 10/03/2018 and 10/06/2018 EXAM: LEFT ELBOW - COMPLETE 3+ VIEW COMPARISON:  None. FINDINGS: There is no acute fracture. No dislocation or appreciable joint effusion. 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 There is soft tissue swelling posteriorly. IMPRESSION: No acute osseous abnormality. 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 Electronically Signed   By: Francene BoyersJames  Maxwell M.D.   On: 10/08/2018 16:38   Ct Head Wo Contrast  Result Date: 10/08/2018 CLINICAL DATA:  Multiple recent falls. EXAM: CT HEAD WITHOUT CONTRAST TECHNIQUE: Contiguous axial images were obtained from the base of the skull through the vertex without intravenous contrast. COMPARISON:  10/08/2013 FINDINGS: Brain: There is new extensive encephalomalacia involving the left temporal lobe, and there is milder encephalomalacia involving the left frontal lobe. There is ex vacuo dilatation of the left lateral ventricle. No acute large territory infarct, intracranial hemorrhage, mass, midline  shift, or extra-axial fluid collection is identified. There is a small chronic right cerebellar  infarct. Patchy hypodensities in the cerebral white matter bilaterally are nonspecific but compatible with mild-to-moderate chronic small vessel ischemic disease, perhaps mildly progressed from the prior study. Chronic bilateral basal ganglia region lacunar infarcts are again noted. Vascular: Calcified atherosclerosis at the skull base. No hyperdense vessel. Skull: No fracture or focal osseous lesion. Sinuses/Orbits: Visualized paranasal sinuses and mastoid air cells are clear. Visualized orbits are unremarkable. Other: None. IMPRESSION: 1. No evidence of acute intracranial abnormality. 2. New encephalomalacia involving the left temporal and left frontal lobes which may reflect the sequelae of interval chronic infarcts or remote trauma. 3. Chronic small vessel ischemia with chronic lacunar infarcts as above. Electronically Signed   By: Sebastian AcheAllen  Grady M.D.   On: 10/08/2018 17:02   ASSESSMENT AND PLAN:   *Hyponatremia secondary to dehydration.  Will change IV fluids from normal saline to half-normal saline  * UTI.  On IV ceftriaxone.  Waiting on urine cultures.  *Acute rhabdomyolysis.  Likely due to fall and laying on the floor.  CK improving.  Continues to be elevated and will need further IV fluids.  Repeat CK level in the morning.  *Elevated troponin due to rhabdomyolysis.  No EKG changes.  No chest pain or shortness of breath.  Not MI.  *Stage II sacral decubitus ulcer.  Appreciate surgical input.  Dressing changes to be continued.  *Weakness and fall.  PT evaluation.  Skilled nursing facility at discharge.  *  Dehydration.   IVF change to 1/2 NS  *Anemia of chronic disease.  Stable  All the records are reviewed and case discussed with Care Management/Social Worker Management plans discussed with the patient, family and they are in agreement.  CODE STATUS: DNR  DVT Prophylaxis: SCDs  TOTAL TIME TAKING CARE OF THIS PATIENT: 35 minutes.   POSSIBLE D/C IN 1-2 DAYS, DEPENDING ON CLINICAL  CONDITION.  Molinda BailiffSrikar R Shaquna Geigle M.D on 10/09/2018 at 11:36 AM  Between 7am to 6pm - Pager - [phone]  After 6pm go to [email or web address] - password [email or web address]  [facility] [place] [facility]  Office  [phone]  CC: Primary care physician; [facility]  Note: This dictation was prepared with Dragon dictation along with smaller phrase technology. Any transcriptional errors that result from this process are unintentional.

## 2018-10-09 NOTE — Progress Notes (Signed)
Notified Dr. Anne HahnWillis of continued pain in feet. Has had tylenol but it is not time for another dose.  Tramadol ordered.

## 2018-10-09 NOTE — Consult Note (Signed)
SURGICAL CONSULTATION NOTE   HISTORY OF PRESENT ILLNESS (HPI):  83 y.o. female admitted due to wekness, multiple fall and elevated troponins. Patient reports that she has felt twice in the last week. She refers is active around home but is falling frequently. She refers she has multiple bruises around her body and the one that cause more discomfort is on the left elbow. Cannot specified what areas of the body she got hit during multiple falls. She refers basically everywhere. Denies severe pain, denies shortness of breath, chest pain or loss of consciousness.  Surgery is consulted by Dr. Renae Gloss in this context for evaluation and management of pressure ulcer.  PAST MEDICAL HISTORY (PMH):  Past Medical History:  Diagnosis Date  . Medical history non-contributory      PAST SURGICAL HISTORY (PSH):  Past Surgical History:  Procedure Laterality Date  . intestinal blockage       MEDICATIONS:  Prior to Admission medications   Medication Sig Start Date End Date Taking? Authorizing Provider  acetaminophen (TYLENOL) 500 MG tablet Take 500 mg by mouth every 6 (six) hours as needed.   Yes [provider]  aspirin 81 MG chewable tablet Chew 81 mg by mouth daily.   Yes [provider]  Multiple Vitamins-Minerals (MULTIVITAMIN ADULT) TABS Take 1 tablet by mouth daily.   Yes [provider]     ALLERGIES:  No Known Allergies   SOCIAL HISTORY:  Social History   Socioeconomic History  . Marital status: Single    Spouse name: Not on file  . Number of children: Not on file  . Years of education: Not on file  . Highest education level: Not on file  Occupational History  . Not on file  Social Needs  . Financial resource strain: Not on file  . Food insecurity:    Worry: Not on file    Inability: Not on file  . Transportation needs:    Medical: Not on file    Non-medical: Not on file  Tobacco Use  . Smoking status: Never Smoker  . Smokeless tobacco: Never Used   Substance and Sexual Activity  . Alcohol use: Never    Frequency: Never  . Drug use: Never  . Sexual activity: Not on file  Lifestyle  . Physical activity:    Days per week: Not on file    Minutes per session: Not on file  . Stress: Not on file  Relationships  . Social connections:    Talks on phone: Not on file    Gets together: Not on file    Attends religious service: Not on file    Active member of club or organization: Not on file    Attends meetings of clubs or organizations: Not on file    Relationship status: Not on file  . Intimate partner violence:    Fear of current or ex partner: Not on file    Emotionally abused: Not on file    Physically abused: Not on file    Forced sexual activity: Not on file  Other Topics Concern  . Not on file  Social History Narrative  . Not on file    FAMILY HISTORY:  Family History  Problem Relation Age of Onset  . CAD Father      REVIEW OF SYSTEMS:  Constitutional: denies weight loss, fever, chills, or sweats  Eyes: denies any other vision changes, history of eye injury  ENT: denies sore throat, hearing problems  Respiratory: denies shortness of breath,  wheezing  Cardiovascular: denies chest pain, palpitations  Gastrointestinal: denies abdominal pain, N/V, or diarrhea/and bowel function as per HPI Genitourinary: denies burning with urination or urinary frequency Musculoskeletal: refers multiple body pains and bruises  Skin: denies any other rashes or skin discolorations. Positive for bruises and pressure ulcer.  Neurological: denies any other headache, dizziness, Positive for weakness  Psychiatric: denies any other depression, anxiety   All other review of systems were negative   VITAL SIGNS:  Temp:  [97.6 F (36.4 C)-98.6 F (37 C)] 98.4 F (36.9 C) (12/30 0752) Pulse Rate:  [59-79] 59 (12/30 0752) Resp:  [16-22] 19 (12/30 0752) BP: (117-148)/(51-76) 132/64 (12/30 0752) SpO2:  [97 %-100 %] 97 % (12/30 0752) Weight:   [67.1 kg-67.2 kg] 67.2 kg (12/30 0251)     Height: 5\' 2"  (157.5 cm) Weight: 67.2 kg BMI (Calculated): 27.09    PHYSICAL EXAM:  Constitutional:  -- Cooperative, no distress -- Awake, alert, and oriented x3  Eyes:  -- Pupils equally round and reactive to light  -- No scleral icterus  Ear, nose, and throat:  -- No jugular venous distension  Pulmonary:  -- No crackles  -- Equal breath sounds bilaterally -- Breathing non-labored at rest Cardiovascular:  -- S1, S2 present  -- No pericardial rubs Gastrointestinal:  -- Abdomen soft, nontender, non-distended, no guarding or rebound tenderness -- No abdominal masses appreciated, pulsatile or otherwise  Musculoskeletal and Integumentary:  -- multiple skin bruises -- back small area of ischemic skin with surrounding swelling and redness. No crepitus or purulent collection.  Neurologic:  -- Sensation: intact and symmetric   Labs:  CBC Latest Ref Rng & Units 10/09/2018 10/08/2018 09/27/2013  WBC 4.0 - 10.5 K/uL 7.5 10.9(H) 7.3  Hemoglobin 12.0 - 15.0 g/dL 10.7(L) 12.4 12.0  Hematocrit 36.0 - 46.0 % 32.8(L) 37.6 35.0  Platelets 150 - 400 K/uL 127(L) 148(L) 117(L)   CMP Latest Ref Rng & Units 10/09/2018 10/08/2018 09/27/2013  Glucose 70 - 99 mg/dL 782(N103(H) 562(Z120(H) 308(M110(H)  BUN 8 - 23 mg/dL 57(Q45(H) 46(N59(H) 13  Creatinine 0.44 - 1.00 mg/dL 6.29(B1.09(H) 2.84(X1.39(H) 3.240.73  Sodium 135 - 145 mmol/L 147(H) 146(H) 140  Potassium 3.5 - 5.1 mmol/L 4.0 4.0 4.2  Chloride 98 - 111 mmol/L 114(H) 113(H) 110(H)  CO2 22 - 32 mmol/L 25 25 23   Calcium 8.9 - 10.3 mg/dL 4.0(N8.7(L) 9.2 8.9  Total Protein 6.5 - 8.1 g/dL - 6.5 -  Total Bilirubin 0.3 - 1.2 mg/dL - 0.8 -  Alkaline Phos 38 - 126 U/L - 60 -  AST 15 - 41 U/L - 81(H) -  ALT 0 - 44 U/L - 58(H) -   Troponin: 0.46 from 0.34, from 0.53  CPK: 585 from 1,025  Imaging studies: I personally evaluated the images.  EXAM: PELVIS - 1-2 VIEW  COMPARISON:  Radiograph of September 24, 2013.  FINDINGS: There is no  evidence of pelvic fracture or diastasis. No pelvic bone lesions are seen.  IMPRESSION: Negative.   Electronically Signed   By: Lupita RaiderJames  Green Jr, M.D.   On: 10/08/2018 17:39  Assessment/Plan:  83 y.o. female consulted for evaluation of pressure ulcer on the lower back/sacral area, complicated by pertinent comorbidities including carotid disease, hypertension, multiple falls, elevated troponin and CPK levels. Regarding the pressure ulcer stage II I recommend loca care management. There is no sing of infection at this moment that warrants surgical debridement Small ischemic patch of skin may work a biological dressing that with adequate local care should  dry out. This will be better tolerated than a larger open wound from the debridement that will take her months to heal on this patient that is very thin, frequently falling, now with elevated troponin and CPK level. Recommend to keep skin dry, clean, frequent position changes, avoid pressure on that area as the usual recommendations of pressure ulcer management.   Gae GallopEdgardo Cintrn-Daz, MD

## 2018-10-09 NOTE — Evaluation (Signed)
Physical Therapy Evaluation Patient Details Name: Kim [name] A Wrobleski MRN: [id number] DOB: 10/14/1934 Today's Date: 10/09/2018   History of Present Illness  Pt is a 83 y/o F who presented after having several falls at home.  Pt with sore on her buttocks. Pt with acute cystitis.    Clinical Impression  Pt admitted with above diagnosis. Pt currently with functional limitations due to the deficits listed below (see PT Problem List). Unsure of pt's cognition at baseline but pt confused and has difficulty answer home layout and PLOF questions.  She currently requires assist for bed mobility, sit>stand, and to ambulate a very short distance.  She is unsteady with OOB, placing her at an increased risk of falling.  She has had several recent falls.  Given pt's current mobility status, recommending SNF at d/c. Given pt's stage II pressure ulcer on her buttocks, recommending air mattress for pressure relief, RN notified.  Pt will benefit from skilled PT to increase their independence and safety with mobility to allow discharge to the venue listed below.      Follow Up Recommendations SNF    Equipment Recommendations  Other (comment)(TBD at next venue of care)    Recommendations for Other Services       Precautions / Restrictions Precautions Precautions: Fall;Other (comment) Precaution Comments: extensive stage II ulcer on buttocks Restrictions Weight Bearing Restrictions: No      Mobility  Bed Mobility Overal bed mobility: Needs Assistance Bed Mobility: Supine to Sit;Sit to Supine     Supine to sit: Mod assist;HOB elevated Sit to supine: Mod assist   General bed mobility comments: Pt requires assist to elevate trunk and to scoot to EOB.  She requires assist to manage BLEs to return to supine  Transfers Overall transfer level: Needs assistance Equipment used: Rolling walker (2 wheeled) Transfers: Sit to/from Stand Sit to Stand: Min assist         General transfer comment: Pt does not  follow cues for proper hand placement despite verbal and tactile cues and hand over hand assist.  Assist to boost to standing and for controlled descent to sit.   Ambulation/Gait Ambulation/Gait assistance: Min assist Gait Distance (Feet): 6 Feet Assistive device: Rolling walker (2 wheeled) Gait Pattern/deviations: Decreased step length - right;Decreased step length - left;Antalgic;Trunk flexed Gait velocity: decreased   General Gait Details: Pt with poor foot clearance and unsteady, even with use of RW.  Pt takes a few steps and then reports need to have a BM.   Stairs            Wheelchair Mobility    Modified Rankin (Stroke Patients Only)       Balance Overall balance assessment: Needs assistance;History of Falls Sitting-balance support: Single extremity supported;Feet supported Sitting balance-Leahy Scale: Poor Sitting balance - Comments: Pt relies on at least 1UE support sitting EOB   Standing balance support: During functional activity;Single extremity supported Standing balance-Leahy Scale: Poor Standing balance comment: Pt relies on at least 1UE support for static and dynamic activities                             Pertinent Vitals/Pain Pain Assessment: Faces Faces Pain Scale: Hurts little more Pain Location: L elbow Pain Descriptors / Indicators: Discomfort;Guarding;Moaning Pain Intervention(s): Limited activity within patient's tolerance;Monitored during session;Repositioned    Home Living Family/patient expects to be discharged to:: Private residence Living Arrangements: Alone Available Help at Discharge: Neighbor;Available PRN/intermittently Type of Home: House  Home Access: Stairs to enter Entrance Stairs-Rails: (unclear) Entrance Stairs-Number of Steps: 2   Home Equipment: Walker - 2 wheels;Cane - single point;Cane - quad Additional Comments: Pt is a very poor historian and clearly confused when answering questions, often laughing when she  doesn't know the answer.     Prior Function Level of Independence: Needs assistance   Gait / Transfers Assistance Needed: Pt ambulating with SPC or quad cane in community and outdoors.  Not using AD in home.  Pt reports 2 falls in the past 2 weeks but unable to report frequency of falls before this.   ADL's / Homemaking Assistance Needed: Pt has meals on wheels, unclear what she does on the two days they don't deliver.    Comments: Pt is a very poor historian and clearly confused when answering questions, often laughing when she doesn't know the answer.      Hand Dominance        Extremity/Trunk Assessment   Upper Extremity Assessment Upper Extremity Assessment: LUE deficits/detail;Generalized weakness LUE Deficits / Details: brusing to L elbow with pt keeping it in a guarded flexed position.  Able to passively move L elbow through ~80% of full range    Lower Extremity Assessment Lower Extremity Assessment: Generalized weakness    Cervical / Trunk Assessment Cervical / Trunk Assessment: Kyphotic  Communication   Communication: No difficulties  Cognition Arousal/Alertness: Awake/alert Behavior During Therapy: WFL for tasks assessed/performed Overall Cognitive Status: No family/caregiver present to determine baseline cognitive functioning                                 General Comments: Pt unable to provide clear answers to history taking.  With mobility, pt requires frequent reminders to remember what she is currently doing.        General Comments      Exercises General Exercises - Lower Extremity Hip Flexion/Marching: Both;5 reps;Standing   Assessment/Plan    PT Assessment Patient needs continued PT services  PT Problem List Decreased strength;Decreased range of motion;Decreased activity tolerance;Decreased balance;Decreased mobility;Decreased cognition;Decreased knowledge of use of DME;Decreased safety awareness;Pain       PT Treatment  Interventions DME instruction;Gait training;Stair training;Functional mobility training;Therapeutic activities;Therapeutic exercise;Balance training;Neuromuscular re-education;Cognitive remediation;Patient/family education;Wheelchair mobility training;Modalities    PT Goals (Current goals can be found in the Care Plan section)  Acute Rehab PT Goals Patient Stated Goal: none stated PT Goal Formulation: With patient Time For Goal Achievement: 10/23/18 Potential to Achieve Goals: Fair    Frequency Min 2X/week   Barriers to discharge Other (comment) Unsure of amount of assist available at d/c    Co-evaluation               AM-PAC PT "6 Clicks" Mobility  Outcome Measure Help needed turning from your back to your side while in a flat bed without using bedrails?: A Lot Help needed moving from lying on your back to sitting on the side of a flat bed without using bedrails?: A Lot Help needed moving to and from a bed to a chair (including a wheelchair)?: A Little Help needed standing up from a chair using your arms (e.g., wheelchair or bedside chair)?: A Little Help needed to walk in hospital room?: A Lot Help needed climbing 3-5 steps with a railing? : A Lot 6 Click Score: 14    End of Session Equipment Utilized During Treatment: Gait belt Activity Tolerance: Patient limited by fatigue;Other (  comment)(need to have BM) Patient left: in bed;with call bell/phone within reach;with nursing/sitter in room(NT assisting with bed pan placement) Nurse Communication: Mobility status;Other (comment)(bed alarm not on; suggest air mattress) PT Visit Diagnosis: Repeated falls (R29.6);Muscle weakness (generalized) (M62.81);Unsteadiness on feet (R26.81);Other abnormalities of gait and mobility (R26.89)    Time: [id number] PT Time Calculation (min) (ACUTE ONLY): 23 min   Charges:   PT Evaluation $PT Eval Moderate Complexity: 1 Mod PT Treatments $Therapeutic Activity: 8-22 mins        Encarnacion ChuAshley  Abashian PT, DPT 10/09/2018, 11:15 AM

## 2018-10-09 NOTE — NC FL2 (Signed)
Short MEDICAID FL2 LEVEL OF CARE SCREENING TOOL     IDENTIFICATION  Patient Name: Kim [name] Birthdate: 02/04/1935 Sex: female Admission Date (Current Location): 10/08/2018  [place]   Facility and Address:  [facility], [address], [place], [place] [address]      Provider Number: [id number]  Attending Physician Name and Address:  Milagros LollSudini, Srikar, MD  Relative Name and Phone Number:  [name],[name]  [phone]      Current Level of Care: Hospital Recommended Level of Care: Skilled Nursing Facility Prior Approval Number:    Date Approved/Denied:   PASRR Number: [id number]  Discharge Plan: SNF    Current Diagnoses: Patient Active Problem List   Diagnosis Date Noted  . Elevated troponin 10/08/2018  . Rhabdomyolysis 10/08/2018    Orientation RESPIRATION BLADDER Height & Weight     Self, Time, Situation, Place  Normal Incontinent Weight: 148 lb 2.7 oz (67.2 kg) Height:  5\' 2"  (157.5 cm)  BEHAVIORAL SYMPTOMS/MOOD NEUROLOGICAL BOWEL NUTRITION STATUS      Continent Diet(Regular diet)  AMBULATORY STATUS COMMUNICATION OF NEEDS Skin   Limited Assist Verbally Surgical wounds, Other (Comment)(Unstageable PU)                       Personal Care Assistance Level of Assistance  Bathing, Feeding, Dressing Bathing Assistance: Limited assistance Feeding assistance: Independent Dressing Assistance: Limited assistance     Functional Limitations Info  Sight, Hearing, Speech Sight Info: Adequate Hearing Info: Adequate Speech Info: Adequate    SPECIAL CARE FACTORS FREQUENCY  PT (By licensed PT), OT (By licensed OT)     PT Frequency: 5x a week OT Frequency: 5x a week            Contractures Contractures Info: Not present    Additional Factors Info  Code Status, Allergies Code Status Info: DNR Allergies Info: NKA           Current Medications (10/09/2018):  This is the current hospital  active medication list Current Facility-Administered Medications  Medication Dose Route Frequency Provider Last Rate Last Dose  . 0.45 % sodium chloride infusion   Intravenous Continuous Milagros LollSudini, Srikar, MD 75 mL/hr at 10/09/18 0845    . acetaminophen (TYLENOL) tablet 650 mg  650 mg Oral Q6H PRN Alford HighlandWieting, Richard, MD   650 mg at 10/08/18 2045   Or  . acetaminophen (TYLENOL) suppository 650 mg  650 mg Rectal Q6H PRN Alford HighlandWieting, Richard, MD      . acetaminophen (TYLENOL) tablet 500 mg  500 mg Oral Q6H PRN Wieting, Richard, MD      . aspirin chewable tablet 81 mg  81 mg Oral Daily Alford HighlandWieting, Richard, MD   81 mg at 10/09/18 [id number]  . cefTRIAXone (ROCEPHIN) 1 g in sodium chloride 0.9 % 100 mL IVPB  1 g Intravenous Q24H Alford HighlandWieting, Richard, MD 200 mL/hr at 10/09/18 1648 1 g at 10/09/18 1648  . enoxaparin (LOVENOX) injection 40 mg  40 mg Subcutaneous Q24H Milagros LollSudini, Srikar, MD   40 mg at 10/09/18 1641  . multivitamin with minerals tablet 1 tablet  1 tablet Oral Daily Alford HighlandWieting, Richard, MD   1 tablet at 10/09/18 [phone]  . ondansetron (ZOFRAN) tablet 4 mg  4 mg Oral Q6H PRN Wieting, Richard, MD       Or  . ondansetron [facility](ZOFRAN) injection 4 mg  4 mg Intravenous Q6H PRN Alford HighlandWieting, Richard, MD      . traMADol Janean Sark(ULTRAM) tablet  50 mg  50 mg Oral Q12H PRN Oralia ManisWillis, David, MD   50 mg at 10/09/18 1641     Discharge Medications: Please see discharge summary for a list of discharge medications.  Relevant Imaging Results:  Relevant Lab Results:   Additional Information SSN [id number]  Darleene Cleavernterhaus, Celica Kotowski R, [place]

## 2018-10-09 NOTE — Care Management Note (Signed)
Case Management Note  Patient Details  Name: Kim [name] MRN: [id number] Date of Birth: 11/24/1934  Subjective/Objective:    RNCM to patient bedside to discuss SNF recommendation.  She states she would like to go to [facility].  Notified Minerva Areolaric, CSW.  Patient lives alone; does not have any family.  Has a few neighbors that help her.  She could not remember her pharmacy.  States "I don't take any medications".                  Action/Plan:   Expected Discharge Date:                  Expected Discharge Plan:  Skilled Nursing Facility  In-House Referral:     Discharge planning Services  CM Consult  Post Acute Care Choice:    Choice offered to:     DME Arranged:    DME Agency:     HH Arranged:    HH Agency:     Status of Service:  Completed, signed off  If discussed at [employer] Length of Stay Meetings, dates discussed:    Additional Comments:  Sherren KernsJennifer L Arizbeth Cawthorn, RN 10/09/2018, 4:20 PM

## 2018-10-09 NOTE — Progress Notes (Signed)
PT Cancellation Note  Patient Details Name: Kim [name] MRN: [id number] DOB: 03/12/1935   Cancelled Treatment:    Reason Eval/Treat Not Completed: Other (comment). Pt currently eating breakfast and requests that PT return at a later time.  Will continue to follow acutely.    Encarnacion ChuAshley Rehman Levinson PT, DPT 10/09/2018, 8:59 AM

## 2018-10-09 NOTE — Clinical Social Work Note (Signed)
CSW was informed that PT is recommending SNF placement.  Patient informed case manager that patient prefers [facility].  [place].  Ervin KnackEric R. Hassan Rowannterhaus, MSW, Theresia MajorsLCSWA [phone]  10/09/2018 5:34 PM

## 2018-10-10 DIAGNOSIS — L8990 Pressure ulcer of unspecified site, unspecified stage: Secondary | ICD-10-CM

## 2018-10-10 LAB — BASIC METABOLIC PANEL
Anion gap: 6 (ref 5–15)
BUN: 41 mg/dL — ABNORMAL HIGH (ref 8–23)
CO2: 25 mmol/L (ref 22–32)
Calcium: 8.4 mg/dL — ABNORMAL LOW (ref 8.9–10.3)
Chloride: 113 mmol/L — ABNORMAL HIGH (ref 98–111)
Creatinine, Ser: 0.95 mg/dL (ref 0.44–1.00)
GFR calc Af Amer: >60 mL/min (ref >60)
GFR calc non Af Amer: 56 mL/min — ABNORMAL LOW (ref >60)
Glucose, Bld: 115 mg/dL — ABNORMAL HIGH (ref 70–99)
Potassium: 4.1 mmol/L (ref 3.5–5.1)
Sodium: 144 mmol/L (ref 135–145)

## 2018-10-10 LAB — CK: Total CK: 293 U/L — ABNORMAL HIGH (ref 38–234)

## 2018-10-10 NOTE — Clinical Social Work Note (Addendum)
CSW presented bed offers to patient and she chose [facility] [place].  CSW contacted [facility] and they can accept patient on Wednesday if she is medically ready for discharge and orders have been received.  CSW updated patient's family friend Roma Kayserimmy Wrenn, [phone].  Timmy would like to be notified once patient is ready for discharge.  Ervin KnackEric R. Hester Forget, MSW, Theresia MajorsLCSWA [phone]  10/10/2018 3:29 PM

## 2018-10-10 NOTE — Clinical Social Work Note (Signed)
Clinical Social Work Assessment  Patient Details  Name: Kim [name] MRN: [id number] Date of Birth: 07/13/1935  Date of referral:  10/10/18               Reason for consult:  Facility Placement                Permission sought to share information with:  Facility Medical sales representativeContact Representative Permission granted to share information::  Yes, Verbal Permission Granted  Name::     Kim [name]  [phone]   Agency::  SNF admissions  Relationship::     Contact Information:     Housing/Transportation Living arrangements for the past 2 months:  Single Family Home Source of Information:  Patient Patient Interpreter Needed:  None Criminal Activity/Legal Involvement Pertinent to Current Situation/Hospitalization: Significant Relationships:  Adult Children Lives with:  Self Do you feel safe going back to the place where you live?  No Need for family participation in patient care:  No (Coment)  Care giving concerns: Patient feels that she needs some short term rehab before she is able to return back home.    Social Worker assessment / plan:  Patient is an 83 year old female who lives alone, patient states she has been to SNF in the past.  Patient was at [facility] and would like to return back if possible.  CSW explained process of rehab, and how insurance will pay for stay at SNF.  Patient expressed that she is familiar with what to expect.  Patient gave CSW permission [place]. Employment status:  Retired Health and safety inspectornsurance information:  Medicare PT Recommendations:  Skilled Nursing Facility Information / Referral to community resources:  Skilled Nursing Facility  Patient/Family's Response Patient and family are in agreement to having patient go to SNF Patient/Family's Understanding of and Emotional Response to Diagnosis, Current Treatment, and Prognosis:  Patient is hopeful she won't have to be there very long.  Emotional Assessment Appearance:  Appears stated  age Attitude/Demeanor/Rapport:    Affect (typically observed):  Appropriate, Stable Orientation:  Oriented to Self, Oriented to Place, Oriented to  Time, Oriented to Situation Alcohol / Substance use:  Not Applicable Psych involvement (Current and /or in the community):  No (Comment)  Discharge Needs  Concerns to be addressed:  Lack of Support, Care Coordination Readmission within the last 30 days:  No Current discharge risk:  Lack of support system, Lives alone Barriers to Discharge:  Continued Medical Work up, [employer] Authorization   Kim [name], LCSWA 10/10/2018, 4:21 PM

## 2018-10-10 NOTE — Progress Notes (Signed)
SOUND Phys[employer] - [place] at [facility]   PATIENT NAME: Kim [name]    MR#:  [id number]  DATE OF BIRTH:  09/13/1935  SUBJECTIVE:  CHIEF COMPLAINT:   Chief Complaint  Patient presents with  . Fall   Pleasant but confused. No concerns  REVIEW OF SYSTEMS:    Review of Systems  Unable to perform ROS: Mental status change    DRUG ALLERGIES:  No Known Allergies  VITALS:  Blood pressure 131/69, pulse 63, temperature 98.2 F (36.8 C), temperature source Oral, resp. rate 19, height 5\' 2"  (1.575 m), weight 67.2 kg, SpO2 98 %.  PHYSICAL EXAMINATION:   Physical Exam  GENERAL:  83 y.o.-year-old patient lying in the bed with no acute distress.  EYES: Pupils equal, round, reactive to light and accommodation. No scleral icterus. Extraocular muscles intact.  HEENT: Head atraumatic, normocephalic. Oropharynx and nasopharynx clear.  NECK:  Supple, no jugular venous distention. No thyroid enlargement, no tenderness.  LUNGS: Normal breath sounds bilaterally, no wheezing, rales, rhonchi. No use of accessory muscles of respiration.  CARDIOVASCULAR: S1, S2 normal. No murmurs, rubs, or gallops.  ABDOMEN: Soft, nontender, nondistended. Bowel sounds present. No organomegaly or mass.  EXTREMITIES: No cyanosis, clubbing or edema b/l.    NEUROLOGIC: Cranial nerves II through XII are intact. Motor 5-/5 in upper and lower extremities PSYCHIATRIC: The patient is alert and awake SKIN: No obvious rash, lesion, or ulcer.   LABORATORY PANEL:   CBC Recent Labs  Lab 10/09/18 0343  WBC 7.5  HGB 10.7*  HCT 32.8*  PLT 127*   ------------------------------------------------------------------------------------------------------------------ Chemistries  Recent Labs  Lab 10/08/18 1529  10/10/18 0329  NA 146*   < > 144  K 4.0   < > 4.1  CL 113*   < > 113*  CO2 25   < > 25  GLUCOSE 120*   < > 115*  BUN 59*   < > 41*  CREATININE 1.39*   < > 0.95  CALCIUM 9.2   < > 8.4*  AST 81*  --    --   ALT 58*  --   --   ALKPHOS 60  --   --   BILITOT 0.8  --   --    < > = values in this interval not displayed.   ------------------------------------------------------------------------------------------------------------------  Cardiac Enzymes Recent Labs  Lab 10/08/18 2238  TROPONINI 0.46*   ------------------------------------------------------------------------------------------------------------------  RADIOLOGY:  Dg Pelvis 1-2 Views  Result Date: 10/08/2018 CLINICAL DATA:  Sacral pain.  No known injury. EXAM: PELVIS - 1-2 VIEW COMPARISON:  Radiograph of September 24, 2013. FINDINGS: There is no evidence of pelvic fracture or diastasis. No pelvic bone lesions are seen. IMPRESSION: Negative. Electronically Signed   By: Lupita RaiderJames  Green Jr, M.D.   On: 10/08/2018 17:39   Dg Elbow Complete Left  Result Date: 10/08/2018 CLINICAL DATA:  Left elbow pain and swelling secondary to false on 10/03/2018 and 10/06/2018 EXAM: LEFT ELBOW - COMPLETE 3+ VIEW COMPARISON:  None. FINDINGS: There is no acute fracture. No dislocation or appreciable joint effusion. 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 There is soft tissue swelling posteriorly. IMPRESSION: No acute osseous abnormality. 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 Electronically Signed   By: Francene BoyersJames  Maxwell M.D.   On: 10/08/2018 16:38   Ct Head Wo Contrast  Result Date: 10/08/2018 CLINICAL DATA:  Multiple recent falls. EXAM: CT HEAD WITHOUT CONTRAST TECHNIQUE: Contiguous axial images were obtained from  the base of the skull through the vertex without intravenous contrast. COMPARISON:  10/08/2013 FINDINGS: Brain: There is new extensive encephalomalacia involving the left temporal lobe, and there is milder encephalomalacia involving the left frontal lobe. There is ex vacuo dilatation of the left lateral ventricle. No acute large territory infarct, intracranial hemorrhage, mass,  midline shift, or extra-axial fluid collection is identified. There is a small chronic right cerebellar infarct. Patchy hypodensities in the cerebral white matter bilaterally are nonspecific but compatible with mild-to-moderate chronic small vessel ischemic disease, perhaps mildly progressed from the prior study. Chronic bilateral basal ganglia region lacunar infarcts are again noted. Vascular: Calcified atherosclerosis at the skull base. No hyperdense vessel. Skull: No fracture or focal osseous lesion. Sinuses/Orbits: Visualized paranasal sinuses and mastoid air cells are clear. Visualized orbits are unremarkable. Other: None. IMPRESSION: 1. No evidence of acute intracranial abnormality. 2. New encephalomalacia involving the left temporal and left frontal lobes which may reflect the sequelae of interval chronic infarcts or remote trauma. 3. Chronic small vessel ischemia with chronic lacunar infarcts as above. Electronically Signed   By: Sebastian AcheAllen  Grady M.D.   On: 10/08/2018 17:02   ASSESSMENT AND PLAN:   * Hypernatremia secondary to dehydration.  Resolved. Stop IVF  * UTI. Was On IV ceftriaxone. Cx negative. Abx stopped  * Acute rhabdomyolysis.  Likely due to fall and laying on the floor.  CK improving and now close to normal.IVF stopped  *Elevated troponin due to rhabdomyolysis.  No EKG changes.  No chest pain or shortness of breath.  Not MI.  *Stage II sacral decubitus ulcer.  Appreciate surgical input.  Dressing changes to be continued per wound care team.  *Weakness and fall.  PT evaluation.  Skilled nursing facility at discharge.  *  Dehydration.   Resolved  *Anemia of chronic disease.  Stable  All the records are reviewed and case discussed with Care Management/Social Worker Management plans discussed with the patient, family and they are in agreement.  CODE STATUS: DNR  DVT Prophylaxis: SCDs  TOTAL TIME TAKING CARE OF THIS PATIENT: 35 minutes.   POSSIBLE D/C IN 1-2 DAYS, DEPENDING  ON CLINICAL CONDITION.  Molinda BailiffSrikar R Una Yeomans M.D on 10/10/2018 at 12:12 PM  Between 7am to 6pm - Pager - [phone]  After 6pm go to [email or web address] - password [email or web address]  [facility] [place] [facility]  Office  [phone]  CC: Primary care physician; Center, [facility]  Note: This dictation was prepared with Dragon dictation along with smaller phrase technology. Any transcriptional errors that result from this process are unintentional.

## 2018-10-10 NOTE — Plan of Care (Signed)
  Problem: Pain Managment: Goal: General experience of comfort will improve Outcome: Progressing   Problem: Safety: Goal: Ability to remain free from injury will improve Outcome: Progressing   Problem: Skin Integrity: Goal: Risk for impaired skin integrity will decrease Outcome: Progressing   

## 2018-10-10 NOTE — Progress Notes (Signed)
Physical Therapy Treatment Patient Details Name: Kim [name] MRN: [id number] DOB: 12/19/1934 Today's Date: 10/10/2018    History of Present Illness Pt is a 83 y/o F who presented after having several falls at home.  Pt with sore on her buttocks. Pt with acute cystitis.      PT Comments    Ms. Kim [name] was pleasantly confused and was agreeable to work with therapy.  She made good progress, ambulating 50 ft with RW and cues for improved mechanics and posture.  She requires assist with transfers and safe ambulation.  Spoke with Dr. Elpidio AnisSudini with recommendations for an air mattress as pt with stage II pressure ulcer.  Given pt's current mobility status, recommending SNF at d/c.      Follow Up Recommendations  SNF     Equipment Recommendations  Other (comment)(TBD at next venue of care)    Recommendations for Other Services       Precautions / Restrictions Precautions Precautions: Fall;Other (comment) Precaution Comments: extensive stage II ulcer on buttocks Restrictions Weight Bearing Restrictions: No    Mobility  Bed Mobility Overal bed mobility: Needs Assistance Bed Mobility: Supine to Sit;Sit to Supine     Supine to sit: Mod assist;HOB elevated Sit to supine: Mod assist   General bed mobility comments: Pt requires assist to elevate trunk and to scoot to EOB.  She requires assist to manage BLEs to return to supine  Transfers Overall transfer level: Needs assistance Equipment used: Rolling walker (2 wheeled) Transfers: Sit to/from Stand Sit to Stand: Min assist         General transfer comment: Mod verbal and tactile cues for proper hand placement for safety.  Assist to boost to standing and to remain steady.    Ambulation/Gait Ambulation/Gait assistance: Min assist Gait Distance (Feet): 50 Feet Assistive device: Rolling walker (2 wheeled) Gait Pattern/deviations: Decreased step length - right;Decreased step length - left;Antalgic;Trunk flexed Gait velocity:  decreased   General Gait Details: Cues for increased step length as pt shuffles feet and taking short steps.  Cues for forward gaze and upright posture.     Stairs             Wheelchair Mobility    Modified Rankin (Stroke Patients Only)       Balance Overall balance assessment: Needs assistance;History of Falls Sitting-balance support: Single extremity supported;Feet supported Sitting balance-Leahy Scale: Poor Sitting balance - Comments: Pt relies on at least 1UE support sitting EOB   Standing balance support: During functional activity;Single extremity supported Standing balance-Leahy Scale: Poor Standing balance comment: Pt relies on at least 1UE support for static and dynamic activities                            Cognition Arousal/Alertness: Awake/alert Behavior During Therapy: WFL for tasks assessed/performed Overall Cognitive Status: No family/caregiver present to determine baseline cognitive functioning                                        Exercises General Exercises - Lower Extremity Heel Slides: AROM;Both;10 reps;Supine Hip Flexion/Marching: Both;10 reps;Other (comment)(hooklying)    General Comments        Pertinent Vitals/Pain Pain Assessment: Faces Faces Pain Scale: Hurts little more Pain Location: L elbow Pain Descriptors / Indicators: Discomfort;Guarding;Moaning Pain Intervention(s): Limited activity within patient's tolerance;Monitored during session    Home Living  Prior Function            PT Goals (current goals can now be found in the care plan section) Acute Rehab PT Goals Patient Stated Goal: none stated PT Goal Formulation: With patient Time For Goal Achievement: 10/23/18 Potential to Achieve Goals: Fair Progress towards PT goals: Progressing toward goals    Frequency    Min 2X/week      PT Plan Current plan remains appropriate    Co-evaluation               AM-PAC PT "6 Clicks" Mobility   Outcome Measure  Help needed turning from your back to your side while in a flat bed without using bedrails?: A Lot Help needed moving from lying on your back to sitting on the side of a flat bed without using bedrails?: A Lot Help needed moving to and from a bed to a chair (including a wheelchair)?: A Little Help needed standing up from a chair using your arms (e.g., wheelchair or bedside chair)?: A Little Help needed to walk in hospital room?: A Lot Help needed climbing 3-5 steps with a railing? : A Lot 6 Click Score: 14    End of Session Equipment Utilized During Treatment: Gait belt Activity Tolerance: Patient tolerated treatment well Patient left: in bed;with call bell/phone within reach;with bed alarm set Nurse Communication: Mobility status PT Visit Diagnosis: Repeated falls (R29.6);Muscle weakness (generalized) (M62.81);Unsteadiness on feet (R26.81);Other abnormalities of gait and mobility (R26.89)     Time: [id number] PT Time Calculation (min) (ACUTE ONLY): 28 min  Charges:  $Gait Training: 8-22 mins $Therapeutic Activity: 8-22 mins                     Encarnacion ChuAshley Badr Piedra PT, DPT 10/10/2018, 12:04 PM

## 2018-10-11 LAB — URINE CULTURE: Culture: >=100000 — AB

## 2018-10-11 MED ORDER — CEPHALEXIN 500 MG PO CAPS
500.0000 mg | ORAL_CAPSULE | Freq: Two times a day (BID) | ORAL | Status: DC
  Administered 2018-10-11: 500 mg via ORAL
  Filled 2018-10-11: qty 1

## 2018-10-11 MED ORDER — CEPHALEXIN 500 MG PO CAPS: 500.0000 mg | ORAL_CAPSULE | Freq: Two times a day (BID) | ORAL | 0 refills | Status: AC

## 2018-10-11 NOTE — Plan of Care (Signed)
  Problem: Education: Goal: Knowledge of General Education information will improve Description: Including pain rating scale, medication(s)/side effects and non-pharmacologic comfort measures Outcome: Progressing   Problem: Health Behavior/Discharge Planning: Goal: Ability to manage health-related needs will improve Outcome: Progressing   Problem: Clinical Measurements: Goal: Ability to maintain clinical measurements within normal limits will improve Outcome: Progressing Goal: Will remain free from infection Outcome: Progressing Goal: Diagnostic test results will improve Outcome: Progressing   Problem: Activity: Goal: Risk for activity intolerance will decrease Outcome: Progressing   Problem: Pain Managment: Goal: General experience of comfort will improve Outcome: Progressing   Problem: Safety: Goal: Ability to remain free from injury will improve Outcome: Progressing   Problem: Skin Integrity: Goal: Risk for impaired skin integrity will decrease Outcome: Progressing   

## 2018-10-11 NOTE — Care Management Important Message (Addendum)
Copy of signed Medicare IM left with patient in room. 

## 2018-10-11 NOTE — Progress Notes (Signed)
Kim [name] to be D/C'd Skilled nursing facility per MD order.  Discussed prescriptions and follow up appointments with the patient. Prescription Placed in packet, report called to kim RN at [facility]  Allergies as of 10/11/2018   No Known Allergies     Medication List    TAKE these medications   acetaminophen 500 MG tablet Commonly known as:  TYLENOL Take 500 mg by mouth every 6 (six) hours as needed.   aspirin 81 MG chewable tablet Chew 81 mg by mouth daily.   cephALEXin 500 MG capsule Commonly known as:  KEFLEX Take 1 capsule (500 mg total) by mouth every 12 (twelve) hours.   MULTIVITAMIN ADULT Tabs Take 1 tablet by mouth daily.       Vitals:   10/11/18 0753 10/11/18 1626  BP: 136/61 (!) 144/65  Pulse: 60 61  Resp: 18 18  Temp: 98.1 F (36.7 C) 98.7 F (37.1 C)  SpO2: 100% 100%    Skin clean, dry and intact without evidence of skin break down, no evidence of skin tears noted. IV catheter discontinued intact. Site without signs and symptoms of complications. Dressing and pressure applied. Pt denies pain at this time. No complaints noted.  An After Visit Summary was printed and placed in packet Patient waiting for EMS to transport to facility  Doxie Augenstein Marylou Flesher

## 2018-10-11 NOTE — Clinical Social Work Note (Signed)
Patient to be d/c'ed today to [facility] room 804.  Patient and family agreeable to plans will transport via ems RN to call report [phone].  CSW updated Roma Kayser, [phone] who is aware that patient is discharging today.  Windell Moulding, MSW, Theresia Majors [phone]

## 2018-10-11 NOTE — Clinical Social Work Placement (Signed)
   CLINICAL SOCIAL WORK PLACEMENT  NOTE  Date:  10/11/2018  Patient Details  Name: Beatris SiLois A Anzaldo MRN: [id number] Date of Birth: 08/15/1935  Clinical Social Work is seeking post-discharge placement for this patient at the Skilled  Nursing Facility level of care (*CSW will initial, date and re-position this form in  chart as items are completed):  Yes   Patient/family provided with [facility] Clinical Social Work Department's list of facilities offering this level of care within the geographic area requested by the patient (or if unable, by the patient's family).  Yes   Patient/family informed of their freedom to choose among providers that offer the needed level of care, that participate in Medicare, Medicaid or managed care program needed by the patient, have an available bed and are willing to accept the patient.  Yes   Patient/family informed of [facility]'s ownership interest in [facility] and [facility], as well as of the fact that they are under no obligation to receive care at these facilities.  PASRR submitted to EDS on 10/10/18     PASRR number received on       Existing PASRR number confirmed on 10/10/18     FL2 transmitted to all facilities in geographic area requested by pt/family on 10/10/18     FL2 transmitted to all facilities within larger geographic area on       Patient informed that his/her managed care company has contracts with or will negotiate with certain facilities, including the following:        Yes   Patient/family informed of bed offers received.  [facility]     Physician recommends and patient chooses bed at      Patient to be transferred to [facility] on 10/11/18.  Patient to be transferred to facility by [facility]     Patient family notified on 10/11/18 of transfer.  Name of family member notified:  Roma Kayserimmy Wrenn [phone]     PHYSICIAN Please sign FL2, Please prepare  prescriptions     Additional Comment:    _______________________________________________ Darleene CleaverAnterhaus, Tionne Carelli R, LCSWA 10/11/2018, 5:02 PM

## 2018-10-11 NOTE — Progress Notes (Signed)
[facility] on [facility] to [facility] to [facility]. No distress noted. Patient on ra. Packet given to ems staff

## 2018-10-11 NOTE — Progress Notes (Signed)
Report called to kim at [facility]. Patients status gone over with nurse. Ems was called for transport. Packet will be given to ems.

## 2018-10-11 NOTE — Discharge Summary (Signed)
[facility] - [place] at [facility]   PATIENT NAME: Kim [name]    MR#:  [id number]  DATE OF BIRTH:  06-May-1935  DATE OF ADMISSION:  10/08/2018 ADMITTING PHYSICIAN: Alford Highland, MD  DATE OF DISCHARGE: 10/11/2018  PRIMARY CARE PHYSICIAN: Center, [facility]   ADMISSION DIAGNOSIS:  Weakness [R53.1] Pain [R52] Elevated troponin [R79.89] Fall, initial encounter [W19.XXXA] Rhabdomyolysis [M62.82]  DISCHARGE DIAGNOSIS:  Active Problems:   Elevated troponin   Rhabdomyolysis   Pressure injury of skin   SECONDARY DIAGNOSIS:   Past Medical History:  Diagnosis Date  . Medical history non-contributory      ADMITTING HISTORY  HISTORY OF PRESENT ILLNESS:  Kim [name]  is a 84 y.o. female comes in after having a few falls at home.  She fell on Christmas Eve and was on the floor at least overnight.  She recently had a fall with carrying wood into the house.  She normally walks with a cane but when she is carrying wood she does not use a cane.  She has been very weak.  Her urine has been dark.  She was noted to have a sore on her backside.  She lives home alone.  Her son is able to check on her numerous times a day.  Patient states that she does have some chills and sweats.  Slight cough but nonproductive.  In the ER she was found to have a borderline troponin and hospitalist services were contacted for further evaluation.  The patient denies chest pain or shortness of breath.  HOSPITAL COURSE:   * Hypernatremia secondary to dehydration.  *E. coli UTI * Acute rhabdomyolysis. *Elevated troponin due to rhabdomyolysis.  No EKG changes.  No chest pain or shortness of breath.  Not MI. *Stage II sacral decubitus ulcer. open to air.  Turn and reposition every two hours.  *Weakness and fall.  PT evaluation done Skilled nursing facility at discharge. *Anemia of chronic disease.  Stable  Patient was admitted to medical floor.  Initially on telemetry monitoring  due to elevated troponin levels.  Troponin levels were stable and these were secondary to acute rhabdomyolysis.  CK levels were monitored and have trended down close to normal with IV fluids.  Her hypernatremia and dehydration have resolved with half-normal saline.  Treated with IV ceftriaxone for UTI.  Discharged on oral Keflex for 3 more days.  She has sacral decubitus ulcer which needs to be kept open to air.  CONSULTS OBTAINED:    DRUG ALLERGIES:  No Known Allergies  DISCHARGE MEDICATIONS:   Allergies as of 10/11/2018   No Known Allergies     Medication List    TAKE these medications   acetaminophen 500 MG tablet Commonly known as:  TYLENOL Take 500 mg by mouth every 6 (six) hours as needed.   aspirin 81 MG chewable tablet Chew 81 mg by mouth daily.   cephALEXin 500 MG capsule Commonly known as:  KEFLEX Take 1 capsule (500 mg total) by mouth every 12 (twelve) hours.   MULTIVITAMIN ADULT Tabs Take 1 tablet by mouth daily.       Today   VITAL SIGNS:  Blood pressure 136/61, pulse 60, temperature 98.1 F (36.7 C), temperature source Oral, resp. rate 18, height 5\' 2"  (1.575 m), weight 54 kg, SpO2 100 %.  I/O:    Intake/Output Summary (Last 24 hours) at 10/11/2018 1333 Last data filed at 10/11/2018 0915 Gross per 24 hour  Intake 480 ml  Output -  Net  480 ml    PHYSICAL EXAMINATION:  Physical Exam  GENERAL:  83 y.o.-year-old patient lying in the bed with no acut66e distress.  LUNGS: Normal breath sounds bilaterally, no wheezing, rales,rhonchi or crepitation. No use of accessory muscles of respiration.  CARDIOVASCULAR: S1, S2 normal. No murmurs, rubs, or gallops.  ABDOMEN: Soft, non-tender, non-distended. Bowel sounds present. No organomegaly or mass.  NEUROLOGIC: Moves all 4 extremities. PSYCHIATRIC: The patient is alert and oriented x 3.  SKIN: No obvious rash, lesion, or ulcer.   DATA REVIEW:   CBC Recent Labs  Lab 10/09/18 0343  WBC 7.5  HGB 10.7*  HCT  32.8*  PLT 127*    Chemistries  Recent Labs  Lab 10/08/18 1529  10/10/18 0329  NA 146*   < > 144  K 4.0   < > 4.1  CL 113*   < > 113*  CO2 25   < > 25  GLUCOSE 120*   < > 115*  BUN 59*   < > 41*  CREATININE 1.39*   < > 0.95  CALCIUM 9.2   < > 8.4*  AST 81*  --   --   ALT 58*  --   --   ALKPHOS 60  --   --   BILITOT 0.8  --   --    < > = values in this interval not displayed.    Cardiac Enzymes Recent Labs  Lab 10/08/18 2238  TROPONINI 0.46*    Microbiology Results  Results for orders placed or performed during the hospital encounter of 10/08/18  Urine Culture     Status: Abnormal   Collection Time: 10/08/18  3:56 PM  Result Value Ref Range Status   Specimen Description   Final    URINE, RANDOM Performed at [facility], [address] [place], [place] [address]    Special Requests NONE  Final   Culture >=100,000 COLONIES/mL ESCHERICHIA COLI (A)  Final   Report Status 10/11/2018 FINAL  Final   Organism ID, Bacteria ESCHERICHIA COLI (A)  Final      Susceptibility   Escherichia coli - MIC*    AMPICILLIN <=2 SENSITIVE Sensitive     CEFAZOLIN <=4 SENSITIVE Sensitive     CEFTRIAXONE <=1 SENSITIVE Sensitive     CIPROFLOXACIN <=0.25 SENSITIVE Sensitive     GENTAMICIN <=1 SENSITIVE Sensitive     IMIPENEM 1 SENSITIVE Sensitive     NITROFURANTOIN <=16 SENSITIVE Sensitive     TRIMETH/SULFA <=20 SENSITIVE Sensitive     AMPICILLIN/SULBACTAM <=2 SENSITIVE Sensitive     PIP/TAZO <=4 SENSITIVE Sensitive     Extended ESBL NEGATIVE Sensitive     * >=100,000 COLONIES/mL ESCHERICHIA COLI    RADIOLOGY:  No results found.  Follow up with PCP in 1 week.  Management plans discussed with the patient, family and they are in agreement.  CODE STATUS:     Code Status Orders  (From admission, onward)         Start     Ordered   10/08/18 1701  Do not attempt resuscitation (DNR)  Continuous    Question Answer Comment  In the event of cardiac or respiratory  ARREST Do not call a "code blue"   In the event of cardiac or respiratory ARREST Do not perform Intubation, CPR, defibrillation or ACLS   In the event of cardiac or respiratory ARREST Use medication by any route, position, wound care, and other measures to relive pain and suffering. May use oxygen, suction  and manual treatment of airway obstruction as needed for comfort.   Comments nurse may pronounce      10/08/18 1701        Code Status History    This patient has a current code status but no historical code status.      TOTAL TIME TAKING CARE OF THIS PATIENT ON DAY OF DISCHARGE: more than 30 minutes.   Molinda Bailiff Jaydrien Wassenaar M.D on 10/11/2018 at 1:33 PM  Between 7am to 6pm - Pager - [phone]  After 6pm go to [email or web address] - password [email or web address]  [facility] [place] [facility]  Office  [phone]  CC: Primary care physician; Center, [facility]  Note: This dictation was prepared with Dragon dictation along with smaller phrase technology. Any transcriptional errors that result from this process are unintentional.

## 2020-06-11 DEATH — deceased

## 2020-09-10 IMAGING — CT CT HEAD W/O CM
3 series · 15 of 46 positions shown, 18 images · non-contrast
Comparison: 10/08/2013

CLINICAL DATA: Multiple recent falls.

EXAM:
CT HEAD WITHOUT CONTRAST
TECHNIQUE: Contiguous axial images were obtained from the base of the skull
through the vertex without intravenous contrast.

[Series 2: head wo · axial · 0.47mm/px · z∈[-191,-71]mm · 9 of 29 slices shown, 12 images]
[im 3/29  brain]
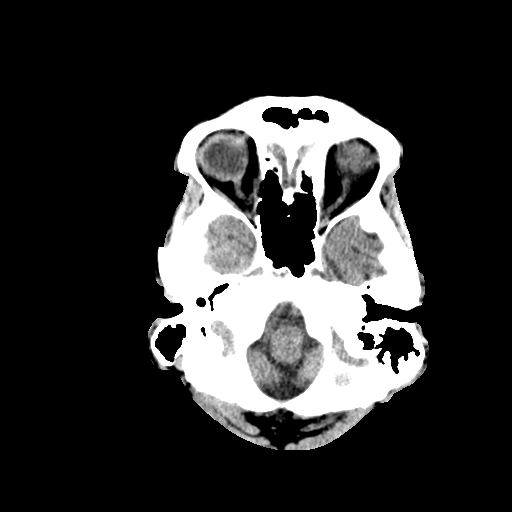
[im 3/29  bone]
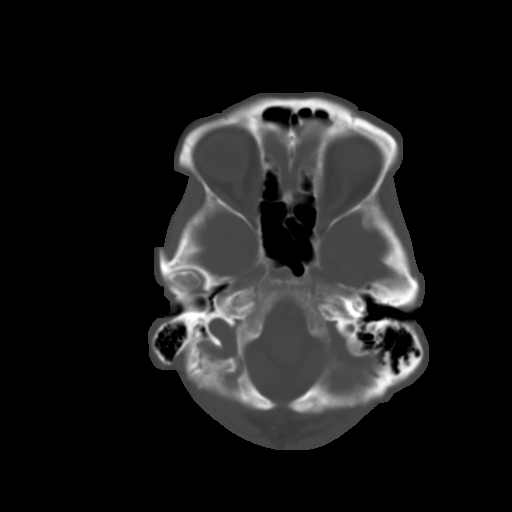
[im 6/29  brain]
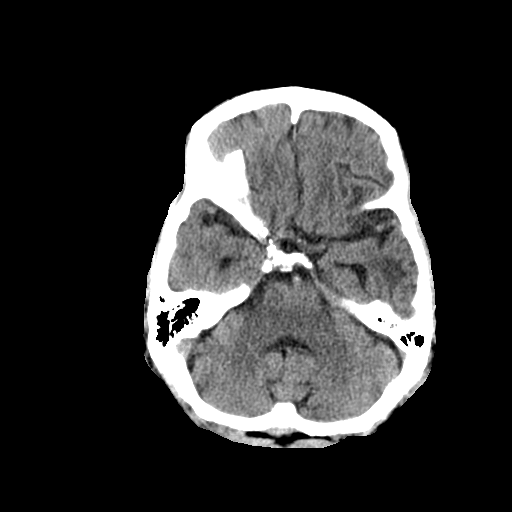
[im 9/29  brain]
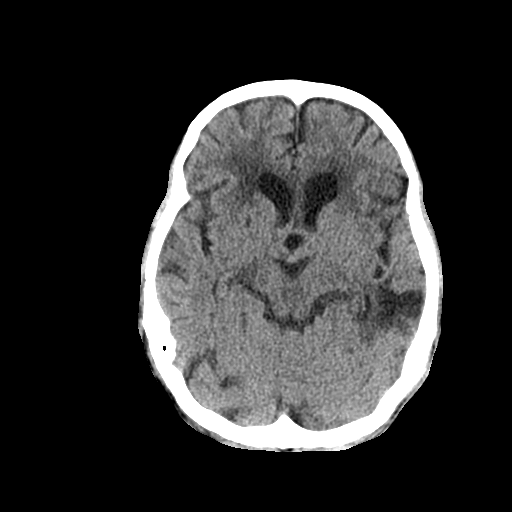
[im 12/29  brain]
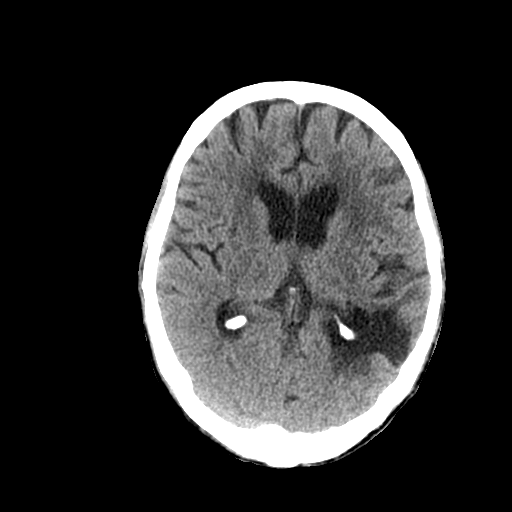
[im 15/29  brain]
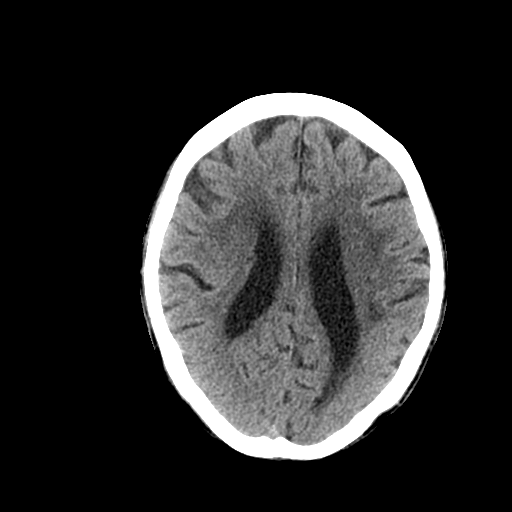
[im 15/29  bone]
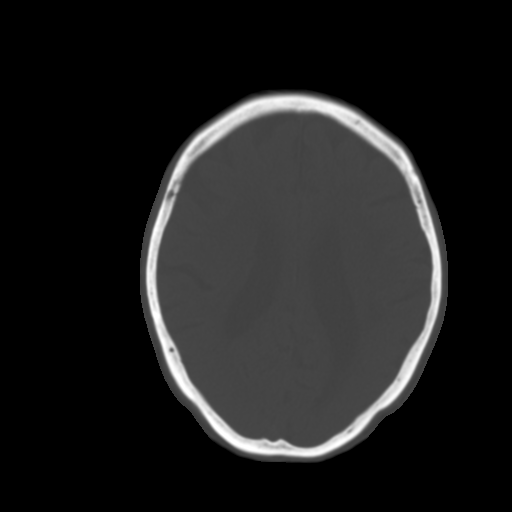
[im 18/29  brain]
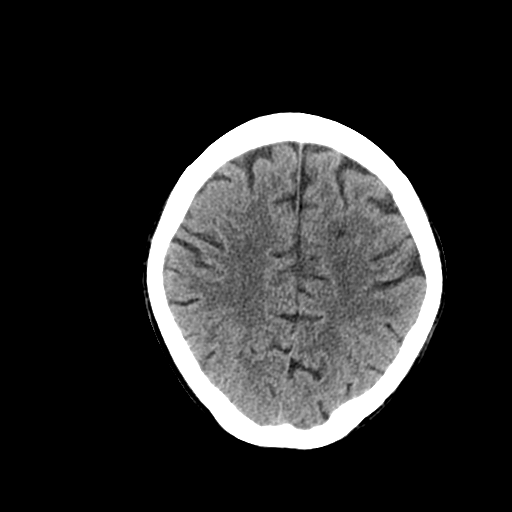
[im 21/29  brain]
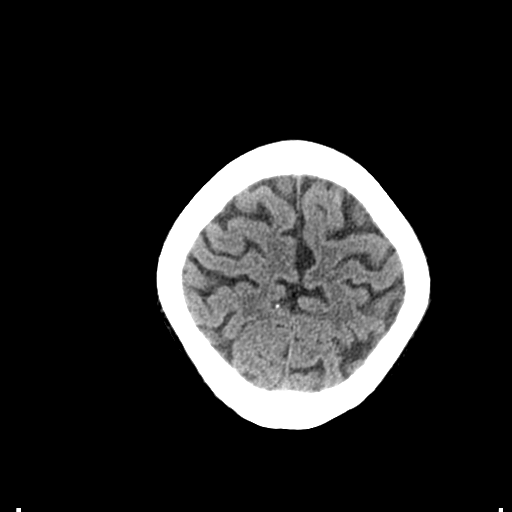
[im 24/29  brain]
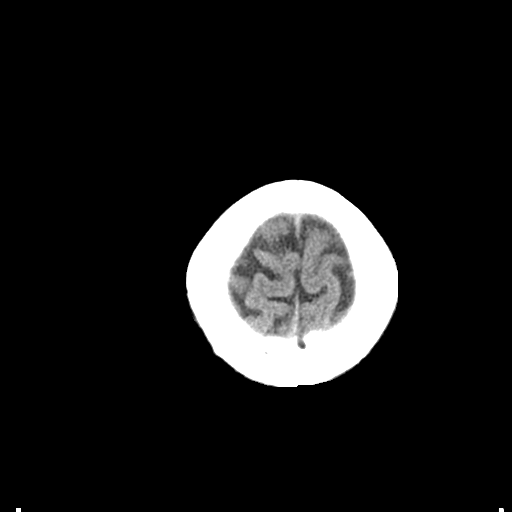
[im 27/29  brain]
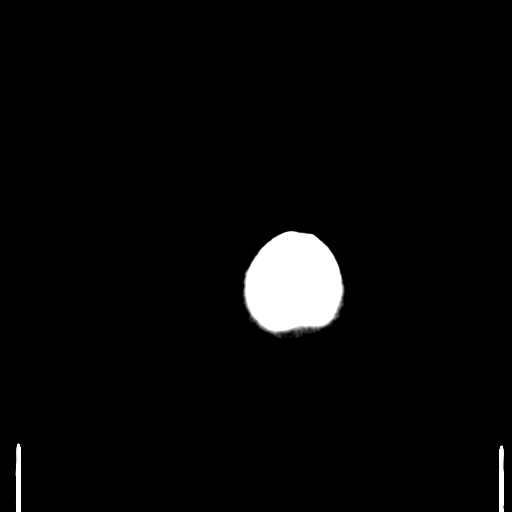
[im 27/29  bone]
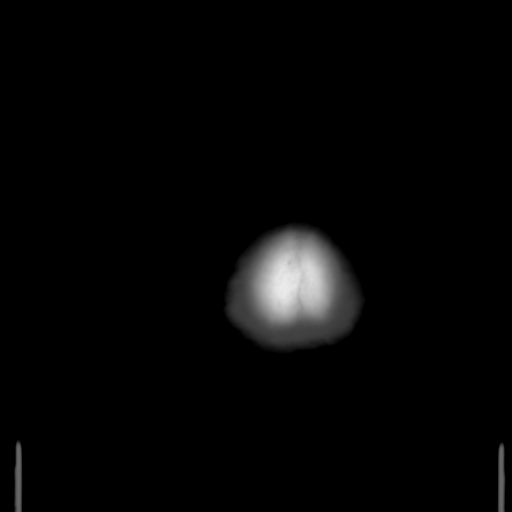

[Series 4: coronal soft tissue · coronal · 0.28mm/px · 3 of 64 slices shown]
[im 22/64  brain]
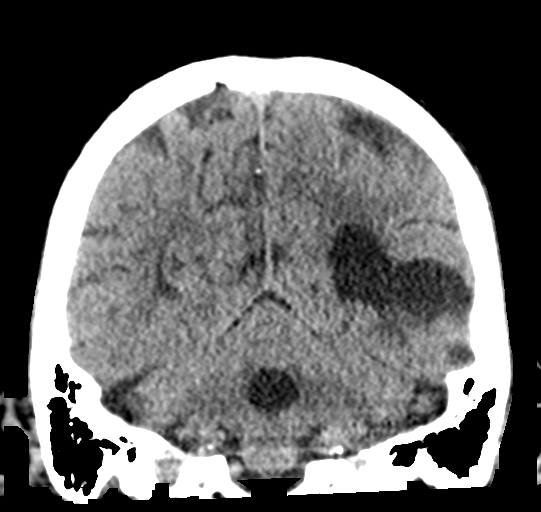
[im 29/64  brain]
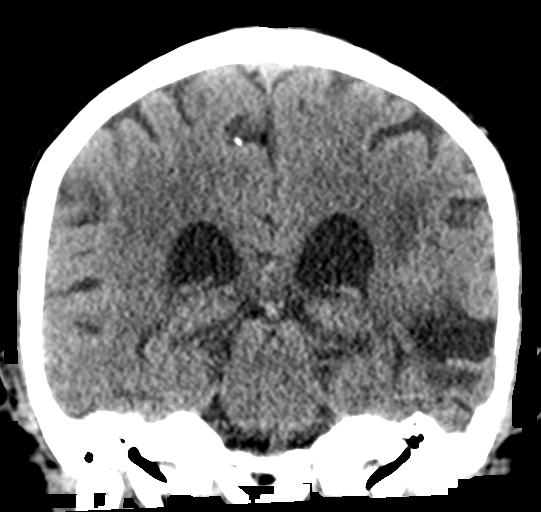
[im 36/64  brain]
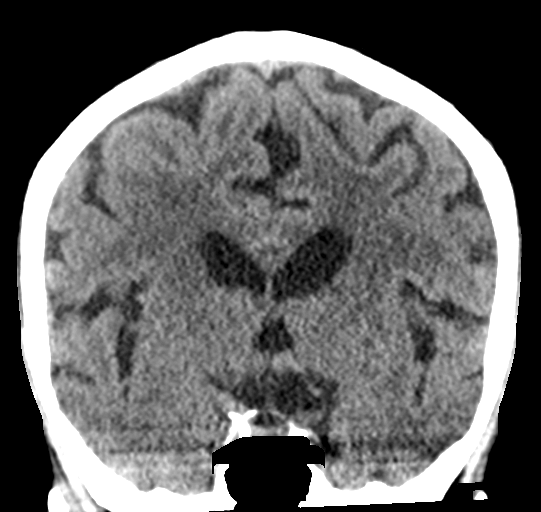

[Series 5: sagittal soft tissue · sagittal · 0.28mm/px · 3 of 51 slices shown]
[im 17/51  brain]
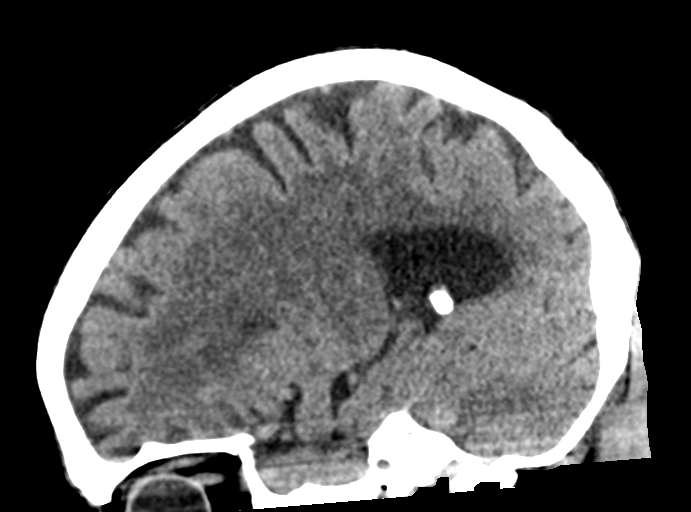
[im 26/51  brain]
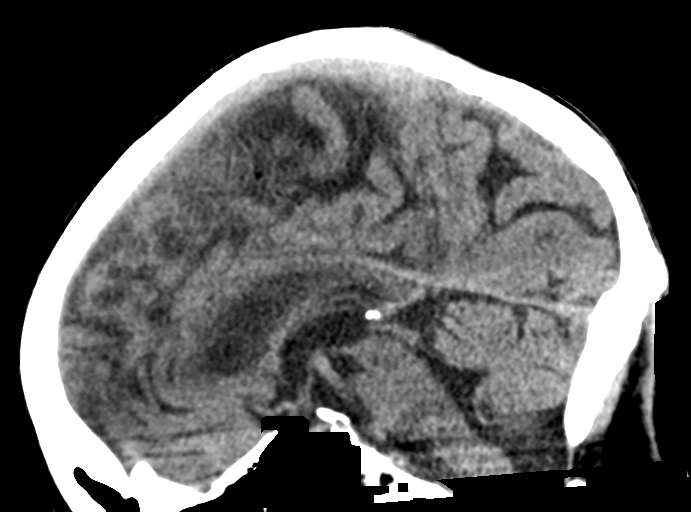
[im 34/51  brain]
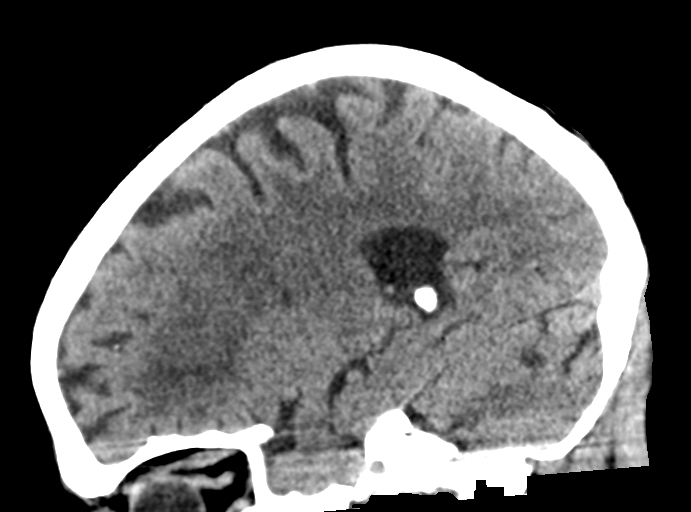

[15 of 46 positions shown; findings below may reference images not displayed]

FINDINGS: Brain: There is new extensive encephalomalacia involving the left
temporal lobe, and there is milder encephalomalacia involving the
left frontal lobe. There is ex vacuo dilatation of the left lateral
ventricle. No acute large territory infarct, intracranial
hemorrhage, mass, midline shift, or extra-axial fluid collection is
identified. There is a small chronic right cerebellar infarct.
Patchy hypodensities in the cerebral white matter bilaterally are
nonspecific but compatible with mild-to-moderate chronic small
vessel ischemic disease, perhaps mildly progressed from the prior
study. Chronic bilateral basal ganglia region lacunar infarcts are
again noted.

Vascular: Calcified atherosclerosis at the skull base. No hyperdense
vessel.

Skull: No fracture or focal osseous lesion.

Sinuses/Orbits: Visualized paranasal sinuses and mastoid air cells
are clear. Visualized orbits are unremarkable.

Other: None.
IMPRESSION: 1. No evidence of acute intracranial abnormality.
2. New encephalomalacia involving the left temporal and left frontal
lobes which may reflect the sequelae of interval chronic infarcts or
remote trauma.
3. Chronic small vessel ischemia with chronic lacunar infarcts as
above.
